# Patient Record
Sex: Female | Born: 1935
Health system: Southern US, Community
[De-identification: ages and names within clinical notes are randomized; demographics above are authoritative.]

## PROBLEM LIST (undated history)

## (undated) DIAGNOSIS — K219 Gastro-esophageal reflux disease without esophagitis: Secondary | ICD-10-CM

## (undated) DIAGNOSIS — E785 Hyperlipidemia, unspecified: Secondary | ICD-10-CM

## (undated) DIAGNOSIS — M1990 Unspecified osteoarthritis, unspecified site: Secondary | ICD-10-CM

## (undated) DIAGNOSIS — G2581 Restless legs syndrome: Secondary | ICD-10-CM

## (undated) DIAGNOSIS — L409 Psoriasis, unspecified: Secondary | ICD-10-CM

## (undated) HISTORY — PX: APPENDECTOMY: SHX54

## (undated) HISTORY — PX: ABDOMINAL HYSTERECTOMY: SHX81

---

## 2011-11-02 ENCOUNTER — Other Ambulatory Visit: Payer: Self-pay | Admitting: Orthopedic Surgery

## 2011-11-02 MED ORDER — BUPIVACAINE 0.25 % ON-Q PUMP SINGLE CATH 300ML: 300.0000 mL | INJECTION | Status: DC

## 2011-11-02 MED ORDER — DEXAMETHASONE SODIUM PHOSPHATE 10 MG/ML IJ SOLN: 10.0000 mg | Freq: Once | INTRAMUSCULAR | Status: DC

## 2011-11-02 NOTE — Progress Notes (Signed)
Preoperative surgical orders have been place into the Epic hospital system for [facility] on 11/02/2011, 8:50 AM  by Patrica Duel for surgery on 11/23/2011.  Preop Total Knee orders including Bupivacaine On-Q pump, IV Tylenol, and IV Decadron as long as there are no contraindications to the above medications. Avel Peace, PA-C

## 2011-11-10 ENCOUNTER — Encounter (HOSPITAL_COMMUNITY): Payer: Self-pay | Admitting: Pharmacy Technician

## 2011-11-16 ENCOUNTER — Encounter (HOSPITAL_COMMUNITY): Payer: Self-pay

## 2011-11-16 ENCOUNTER — Encounter (HOSPITAL_COMMUNITY)
Admission: RE | Admit: 2011-11-16 | Discharge: 2011-11-16 | Disposition: A | Discharge status: Home / Self Care | Payer: Medicare Other | Source: Ambulatory Visit | Attending: Orthopedic Surgery | Admitting: Orthopedic Surgery

## 2011-11-16 HISTORY — DX: Restless legs syndrome: G25.81

## 2011-11-16 HISTORY — DX: Gastro-esophageal reflux disease without esophagitis: K21.9

## 2011-11-16 HISTORY — DX: Hyperlipidemia, unspecified: E78.5

## 2011-11-16 HISTORY — DX: Unspecified osteoarthritis, unspecified site: M19.90

## 2011-11-16 HISTORY — DX: Psoriasis, unspecified: L40.9

## 2011-11-16 LAB — CBC
MCHC: 33.3 g/dL (ref 30.0–36.0)
MCV: 92.3 fL (ref 78.0–100.0)
Platelets: 300 10*3/uL (ref 150–400)
RDW: 13.1 % (ref 11.5–15.5)
WBC: 7.1 10*3/uL (ref 4.0–10.5)

## 2011-11-16 LAB — URINALYSIS, ROUTINE W REFLEX MICROSCOPIC
Bilirubin Urine: NEGATIVE
Hgb urine dipstick: NEGATIVE
Ketones, ur: NEGATIVE mg/dL
Protein, ur: NEGATIVE mg/dL
Urobilinogen, UA: 0.2 mg/dL (ref 0.0–1.0)

## 2011-11-16 LAB — SURGICAL PCR SCREEN: Staphylococcus aureus: NEGATIVE

## 2011-11-16 MED ORDER — CHLORHEXIDINE GLUCONATE 4 % EX LIQD
60.0000 mL | Freq: Once | CUTANEOUS | Status: DC
  Filled 2011-11-16: qty 60

## 2011-11-16 NOTE — Patient Instructions (Addendum)
20 Deborah Kim  11/16/2011   Your procedure is scheduled on: 11/23/11 AT 8:30 AM  Report to SHORT STAY DEPT  At 6:00 AM.  Call this number if you have problems the morning of surgery: [phone]   Remember:   Do not eat food or drink liquids AFTER MIDNIGHT    Take these medicines the morning of surgery with A SIP OF WATER:ZANTAC   Do not wear jewelry, make-up or nail polish.  Do not wear lotions, powders, or perfumes.   Do not shave legs or underarms 48 hrs. before surgery (men may shave face)  Do not bring valuables to the hospital.  Contacts, dentures or bridgework may not be worn into surgery.  Leave suitcase in the car. After surgery it may be brought to your room.  For patients admitted to the hospital, checkout time is 11:00 AM the day of discharge.   Patients discharged the day of surgery will not be allowed to drive home. If going home same day of surgery, must have someone stay with you first 24 hrs at home and arrange for some one to drive you home from hospital.    Special Instructions:   Please read over the following fact sheets that you were given: MRSA  Information / Incentive Spirometer               SHOWER WITH BETASEPT THE NIGHT BEFORE SURGERY AND THE MORNING OF SURGERY          X_______________________________________________________________

## 2011-11-18 NOTE — H&P (Signed)
Deborah Kim DOB: January 09, 1935  Chief Complaint: left knee pain  History of Present Illness The patient is a 77 year old female who comes in today for a preoperative History and Physical. The patient is scheduled for a left total knee arthroplasty to be performed by Dr. Gus Rankin. Aluisio, MD at [facility] on Monday November 23, 2011 . The patient is a 77 year old female who presented with knee complaints. The patient reports left knee symptoms including pain and stiffness which began 4 years ago without any known injury.The patient feels that the symptoms are worsening. Previous work-up for this problem has included arthroscopy in the 80's. She states that the left knee has not been swelling much and pain is mainly when she is standing on it. When she is sitting down and resting the pain gets better. If she has had a very busy day then she will have discomfort at night, otherwise is not having night pain. She is not having any lower extremity weakness or paresthesia. The right knee is currently not hurting. Hips are not hurting. The knee is starting to limit what she can and can not do. She has had cortisone injections three times this year with decreasing effectiveness. Due to the failure of conservative measures, the most predictable means for increased function and decreased pain in the left knee is a left total knee arthroplasty.    Problem List/Past MedicalHistory Osteoarthritis, Knee (715.96) Knee Pain (719.46) Hypercholesterolemia Cataract Pneumonia Urinary Tract Infection Measles Mumps Menopause   Family History Cerebrovascular Accident. brother Hypertension. mother Father. deceased age 14 due to "old age"; cardiomegaly Mother. living age 68; HTN Siblings. brother: lung cancer, stroke, DM type II   Social History Children. 1 Alcohol use. current drinker; drinks wine; only occasionally per week Current work status. retired Exercise.  Exercises rarely Illicit drug use. no Living situation. live with spouse Marital status. married Pain Contract. no Tobacco use. former smoker; smoke(d) 1 pack(s) per day Tobacco / smoke exposure. no Post-Surgical Plans. home with husband Advance Directives. living will   Medication History Calcium & Magnesium Carbonates (520-400MG  Tablet, Oral) Active. Iron Caps CR ( Oral) Specific dose unknown - Active. Vitamin D (1000UNIT Capsule, Oral) Active. Vitamin B 12 ( Lozenge, Oral) Active. Iron (325 (65 Fe)MG Tablet, Oral) Active. Bromaline Plus (500-2-12.5MG  Tablet, Oral) Active. Aspirin EC (81MG  Tablet DR, Oral three times a week) Active.     Past Surgical History Hysterectomy. complete (non-cancerous) 1974 Appendectomy. 1974 Arthroscopic Knee Surgery - Left. 1985    Review of Systems General:Not Present- Chills, Fever, Night Sweats, Fatigue, Weight Gain, Weight Loss and Memory Loss. Skin:Not Present- Hives, Itching, Rash, Eczema and Lesions. HEENT:Not Present- Tinnitus, Headache, Double Vision, Visual Loss, Hearing Loss and Dentures. Respiratory:Present- Allergies. Not Present- Shortness of breath with exertion, Shortness of breath at rest, Coughing up blood and Chronic Cough. Cardiovascular:Not Present- Chest Pain, Racing/skipping heartbeats, Difficulty Breathing Lying Down, Murmur, Swelling and Palpitations. Gastrointestinal:Present- Heartburn. Not Present- Bloody Stool, Abdominal Pain, Vomiting, Nausea, Constipation, Diarrhea, Difficulty Swallowing, Jaundice and Loss of appetitie. Female Genitourinary:Not Present- Blood in Urine, Urinary frequency, Weak urinary stream, Discharge, Flank Pain, Incontinence, Painful Urination, Urgency, Urinary Retention and Urinating at Night. Musculoskeletal:Present- Joint Pain. Not Present- Muscle Weakness, Muscle Pain, Joint Swelling, Back Pain, Morning Stiffness and Spasms. Neurological:Not Present- Tremor,  Dizziness, Blackout spells, Paralysis, Difficulty with balance and Weakness. Psychiatric:Present- Insomnia.   Vitals Weight: 165 lb Height: 65 in Body Surface Area: 1.85 m Body Mass Index: 27.46 kg/m Pulse:  72 (Regular) Resp.: 16 (Unlabored) BP: 128/84 (Sitting, Left Arm, Standard)    Physical Exam General Mental Status - Alert, cooperative and good historian. General Appearance- pleasant. Not in acute distress. Orientation- Oriented X3. Build & Nutrition- Well nourished and Well developed. Head and Neck Head- normocephalic, atraumatic . Neck Global Assessment- supple. no bruit auscultated on the right and no bruit auscultated on the left. Eye Pupil- Bilateral- Regular and Round. Motion- Bilateral- EOMI. Chest and Lung Exam Auscultation: Breath sounds:- clear at anterior chest wall and - clear at posterior chest wall. Adventitious sounds:- No Adventitious sounds. Cardiovascular Auscultation:Rhythm- Regular rate and rhythm. Heart Sounds- S1 WNL and S2 WNL. Murmurs & Other Heart Sounds:Auscultation of the heart reveals - No Murmurs. Abdomen Palpation/Percussion:Tenderness- Abdomen is non-tender to palpation. Rigidity (guarding)- Abdomen is soft. Auscultation:Auscultation of the abdomen reveals - Bowel sounds normal. Female Genitourinary Not done, not pertinent to present illness Peripheral Vascular Upper Extremity: Palpation:- Pulses bilaterally normal. Lower Extremity: Palpation:- Pulses bilaterally normal. Neurologic Examination of related systems reveals - normal muscle strength and tone in all extremities. Neurologic evaluation reveals - normal sensation. Musculoskeletal Her left knee shows no effusion. Range is 5 to 125, slight crepitus on range of motion. Slight tenderness, medial greater than lateral with no instability noted. Right knee exam is unremarkable.   RADIOGRAPHS: She does have significant degenerative  change in the medial compartment of the left knee.   Assessment & Plan Osteoarthritis, Knee (715.96) Left total knee arthroplasty     Dimitri Ped, PA-C

## 2011-11-22 NOTE — Anesthesia Preprocedure Evaluation (Addendum)
Anesthesia Evaluation  Patient identified by MRN, date of birth, ID band Patient awake    Reviewed: Allergy & Precautions, H&P , NPO status , Patient's Chart, lab work & pertinent test results  Airway Mallampati: II TM Distance: >3 FB Neck ROM: Full    Dental No notable dental hx.    Pulmonary former smoker,  breath sounds clear to auscultation  Pulmonary exam normal       Cardiovascular negative cardio ROS  Rhythm:Regular Rate:Normal     Neuro/Psych negative neurological ROS  negative psych ROS   GI/Hepatic negative GI ROS, Neg liver ROS,   Endo/Other  negative endocrine ROS  Renal/GU negative Renal ROS  negative genitourinary   Musculoskeletal negative musculoskeletal ROS (+)   Abdominal   Peds negative pediatric ROS (+)  Hematology negative hematology ROS (+)   Anesthesia Other Findings   Reproductive/Obstetrics negative OB ROS                          Anesthesia Physical Anesthesia Plan  ASA: II  Anesthesia Plan: Spinal   Post-op Pain Management:    Induction: Intravenous  Airway Management Planned:   Additional Equipment:   Intra-op Plan:   Post-operative Plan: Extubation in OR  Informed Consent: I have reviewed the patients History and Physical, chart, labs and discussed the procedure including the risks, benefits and alternatives for the proposed anesthesia with the patient or authorized representative who has indicated his/her understanding and acceptance.   Dental advisory given  Plan Discussed with: CRNA  Anesthesia Plan Comments: (Discussed risks/benefits of spinal including headache, backache, failure, bleeding, infection, and nerve damage. Patient consents to spinal. Questions answered. Coagulation studies and platelet count acceptable. )       Anesthesia Quick Evaluation

## 2011-11-23 ENCOUNTER — Encounter (HOSPITAL_COMMUNITY): Payer: Self-pay | Admitting: *Deleted

## 2011-11-23 ENCOUNTER — Encounter (HOSPITAL_COMMUNITY): Payer: Self-pay | Admitting: Anesthesiology

## 2011-11-23 ENCOUNTER — Encounter (HOSPITAL_COMMUNITY)
Admission: RE | Disposition: A | Discharge status: Home Health | Payer: Self-pay | Source: Ambulatory Visit | Attending: Orthopedic Surgery

## 2011-11-23 ENCOUNTER — Inpatient Hospital Stay (HOSPITAL_COMMUNITY): Payer: Medicare Other | Admitting: Anesthesiology

## 2011-11-23 ENCOUNTER — Inpatient Hospital Stay (HOSPITAL_COMMUNITY)
Admission: RE | Admit: 2011-11-23 | Discharge: 2011-11-26 | DRG: 470 | Disposition: A | Discharge status: Home Health | Payer: Medicare Other | Source: Ambulatory Visit | Attending: Orthopedic Surgery | Admitting: Orthopedic Surgery

## 2011-11-23 DIAGNOSIS — Z96659 Presence of unspecified artificial knee joint: Secondary | ICD-10-CM

## 2011-11-23 DIAGNOSIS — M1710 Unilateral primary osteoarthritis, unspecified knee: Principal | ICD-10-CM | POA: Diagnosis present

## 2011-11-23 DIAGNOSIS — M179 Osteoarthritis of knee, unspecified: Secondary | ICD-10-CM

## 2011-11-23 DIAGNOSIS — I498 Other specified cardiac arrhythmias: Secondary | ICD-10-CM | POA: Diagnosis not present

## 2011-11-23 DIAGNOSIS — D62 Acute posthemorrhagic anemia: Secondary | ICD-10-CM | POA: Diagnosis not present

## 2011-11-23 DIAGNOSIS — E871 Hypo-osmolality and hyponatremia: Secondary | ICD-10-CM | POA: Diagnosis not present

## 2011-11-23 DIAGNOSIS — Z9289 Personal history of other medical treatment: Secondary | ICD-10-CM

## 2011-11-23 DIAGNOSIS — Z01812 Encounter for preprocedural laboratory examination: Secondary | ICD-10-CM

## 2011-11-23 DIAGNOSIS — R000 Tachycardia, unspecified: Secondary | ICD-10-CM | POA: Diagnosis not present

## 2011-11-23 HISTORY — PX: TOTAL KNEE ARTHROPLASTY: SHX125

## 2011-11-23 SURGERY — ARTHROPLASTY, KNEE, TOTAL
Anesthesia: Spinal | Site: Knee | Laterality: Left | Wound class: Clean

## 2011-11-23 MED ORDER — NALOXONE HCL 0.4 MG/ML IJ SOLN: 0.4000 mg | INTRAMUSCULAR | Status: DC | PRN

## 2011-11-23 MED ORDER — CALCIPOTRIENE 0.005 % EX CREA: 1.0000 "application " | TOPICAL_CREAM | CUTANEOUS | Status: DC | PRN

## 2011-11-23 MED ORDER — BUPIVACAINE IN DEXTROSE 0.75-8.25 % IT SOLN
INTRATHECAL | Status: DC | PRN
  Administered 2011-11-23: 15 mg via INTRATHECAL

## 2011-11-23 MED ORDER — MENTHOL 3 MG MT LOZG: 1.0000 | LOZENGE | OROMUCOSAL | Status: DC | PRN

## 2011-11-23 MED ORDER — SODIUM CHLORIDE 0.9 % IR SOLN
Status: DC | PRN
  Administered 2011-11-23: 1000 mL

## 2011-11-23 MED ORDER — ACETAMINOPHEN 10 MG/ML IV SOLN: 1000.0000 mg | Freq: Once | INTRAVENOUS | Status: DC

## 2011-11-23 MED ORDER — TRIAMCINOLONE ACETONIDE 0.5 % EX CREA
TOPICAL_CREAM | Freq: Two times a day (BID) | CUTANEOUS | Status: DC
  Administered 2011-11-23 – 2011-11-24 (×3): via TOPICAL
  Administered 2011-11-25: 1 via TOPICAL
  Administered 2011-11-25 – 2011-11-26 (×2): via TOPICAL
  Filled 2011-11-23 (×2): qty 15

## 2011-11-23 MED ORDER — METOCLOPRAMIDE HCL 5 MG/ML IJ SOLN
5.0000 mg | Freq: Three times a day (TID) | INTRAMUSCULAR | Status: DC | PRN
  Administered 2011-11-23: 10 mg via INTRAVENOUS
  Filled 2011-11-23: qty 2

## 2011-11-23 MED ORDER — PHENOL 1.4 % MT LIQD: 1.0000 | OROMUCOSAL | Status: DC | PRN

## 2011-11-23 MED ORDER — BUPIVACAINE ON-Q PAIN PUMP (FOR ORDER SET NO CHG)
INJECTION | Status: DC
  Filled 2011-11-23: qty 1

## 2011-11-23 MED ORDER — DIPHENHYDRAMINE HCL 50 MG/ML IJ SOLN: 12.5000 mg | Freq: Four times a day (QID) | INTRAMUSCULAR | Status: DC | PRN

## 2011-11-23 MED ORDER — ONDANSETRON HCL 4 MG/2ML IJ SOLN
4.0000 mg | Freq: Four times a day (QID) | INTRAMUSCULAR | Status: DC | PRN
  Administered 2011-11-23: 4 mg via INTRAVENOUS

## 2011-11-23 MED ORDER — BISACODYL 10 MG RE SUPP: 10.0000 mg | Freq: Every day | RECTAL | Status: DC | PRN

## 2011-11-23 MED ORDER — ACETAMINOPHEN 325 MG PO TABS: 650.0000 mg | ORAL_TABLET | Freq: Four times a day (QID) | ORAL | Status: DC | PRN

## 2011-11-23 MED ORDER — CEFAZOLIN SODIUM 1-5 GM-% IV SOLN
1.0000 g | Freq: Four times a day (QID) | INTRAVENOUS | Status: AC
  Administered 2011-11-23 (×2): 1 g via INTRAVENOUS
  Filled 2011-11-23 (×2): qty 50

## 2011-11-23 MED ORDER — OXYCODONE HCL 5 MG PO TABS
5.0000 mg | ORAL_TABLET | ORAL | Status: DC | PRN
  Administered 2011-11-23: 5 mg via ORAL
  Administered 2011-11-24 – 2011-11-26 (×11): 10 mg via ORAL
  Filled 2011-11-23: qty 2
  Filled 2011-11-23: qty 1
  Filled 2011-11-23 (×4): qty 2
  Filled 2011-11-23: qty 1
  Filled 2011-11-23 (×7): qty 2

## 2011-11-23 MED ORDER — DEXTROSE 5 % IV SOLN
3.0000 g | INTRAVENOUS | Status: AC
  Administered 2011-11-23: 2 g via INTRAVENOUS
  Filled 2011-11-23: qty 3000

## 2011-11-23 MED ORDER — DIPHENHYDRAMINE HCL 12.5 MG/5ML PO ELIX: 12.5000 mg | ORAL_SOLUTION | ORAL | Status: DC | PRN

## 2011-11-23 MED ORDER — 0.9 % SODIUM CHLORIDE (POUR BTL) OPTIME
TOPICAL | Status: DC | PRN
  Administered 2011-11-23: 1000 mL

## 2011-11-23 MED ORDER — METHOCARBAMOL 100 MG/ML IJ SOLN
500.0000 mg | Freq: Four times a day (QID) | INTRAVENOUS | Status: DC | PRN
  Administered 2011-11-23: 500 mg via INTRAVENOUS
  Filled 2011-11-23: qty 5

## 2011-11-23 MED ORDER — SODIUM CHLORIDE 0.9 % IV SOLN
INTRAVENOUS | Status: DC
  Administered 2011-11-23: 13:00:00 via INTRAVENOUS
  Administered 2011-11-24: 20 mL/h via INTRAVENOUS
  Administered 2011-11-24: 05:00:00 via INTRAVENOUS

## 2011-11-23 MED ORDER — MIDAZOLAM HCL 5 MG/5ML IJ SOLN
INTRAMUSCULAR | Status: DC | PRN
  Administered 2011-11-23: 2 mg via INTRAVENOUS

## 2011-11-23 MED ORDER — METHOCARBAMOL 500 MG PO TABS
500.0000 mg | ORAL_TABLET | Freq: Four times a day (QID) | ORAL | Status: DC | PRN
  Administered 2011-11-24 – 2011-11-26 (×6): 500 mg via ORAL
  Filled 2011-11-23 (×7): qty 1

## 2011-11-23 MED ORDER — PROMETHAZINE HCL 25 MG/ML IJ SOLN: 6.2500 mg | INTRAMUSCULAR | Status: DC | PRN

## 2011-11-23 MED ORDER — DIPHENHYDRAMINE HCL 12.5 MG/5ML PO ELIX: 12.5000 mg | ORAL_SOLUTION | Freq: Four times a day (QID) | ORAL | Status: DC | PRN

## 2011-11-23 MED ORDER — FLEET ENEMA 7-19 GM/118ML RE ENEM: 1.0000 | ENEMA | Freq: Once | RECTAL | Status: AC | PRN

## 2011-11-23 MED ORDER — FAMOTIDINE 20 MG PO TABS
20.0000 mg | ORAL_TABLET | Freq: Every day | ORAL | Status: DC | PRN
  Administered 2011-11-24: 20 mg via ORAL
  Filled 2011-11-23 (×2): qty 1

## 2011-11-23 MED ORDER — SODIUM CHLORIDE 0.9 % IJ SOLN: 9.0000 mL | INTRAMUSCULAR | Status: DC | PRN

## 2011-11-23 MED ORDER — SODIUM CHLORIDE 0.9 % IV SOLN: INTRAVENOUS | Status: DC

## 2011-11-23 MED ORDER — EPHEDRINE SULFATE 50 MG/ML IJ SOLN
INTRAMUSCULAR | Status: DC | PRN
  Administered 2011-11-23: 10 mg via INTRAVENOUS
  Administered 2011-11-23: 5 mg via INTRAVENOUS

## 2011-11-23 MED ORDER — POLYETHYLENE GLYCOL 3350 17 G PO PACK: 17.0000 g | PACK | Freq: Every day | ORAL | Status: DC | PRN

## 2011-11-23 MED ORDER — TRAMADOL HCL 50 MG PO TABS: 50.0000 mg | ORAL_TABLET | Freq: Four times a day (QID) | ORAL | Status: DC | PRN

## 2011-11-23 MED ORDER — ONDANSETRON HCL 4 MG PO TABS: 4.0000 mg | ORAL_TABLET | Freq: Four times a day (QID) | ORAL | Status: DC | PRN

## 2011-11-23 MED ORDER — FENTANYL CITRATE 0.05 MG/ML IJ SOLN
INTRAMUSCULAR | Status: DC | PRN
  Administered 2011-11-23: 100 ug via INTRAVENOUS

## 2011-11-23 MED ORDER — LACTATED RINGERS IV SOLN
INTRAVENOUS | Status: DC | PRN
  Administered 2011-11-23 (×3): via INTRAVENOUS

## 2011-11-23 MED ORDER — BUPIVACAINE 0.25 % ON-Q PUMP SINGLE CATH 300ML
INJECTION | Status: DC | PRN
  Administered 2011-11-23: 300 mL

## 2011-11-23 MED ORDER — ONDANSETRON HCL 4 MG/2ML IJ SOLN
4.0000 mg | Freq: Four times a day (QID) | INTRAMUSCULAR | Status: DC | PRN
  Filled 2011-11-23: qty 2

## 2011-11-23 MED ORDER — FLUOCINONIDE 0.05 % EX SOLN
1.0000 "application " | CUTANEOUS | Status: DC
  Filled 2011-11-23: qty 60

## 2011-11-23 MED ORDER — HYDROMORPHONE HCL PF 1 MG/ML IJ SOLN: 0.2500 mg | INTRAMUSCULAR | Status: DC | PRN

## 2011-11-23 MED ORDER — ACETAMINOPHEN 10 MG/ML IV SOLN
1000.0000 mg | Freq: Four times a day (QID) | INTRAVENOUS | Status: AC
  Administered 2011-11-23 – 2011-11-24 (×4): 1000 mg via INTRAVENOUS
  Filled 2011-11-23 (×6): qty 100

## 2011-11-23 MED ORDER — PROPOFOL 10 MG/ML IV EMUL
INTRAVENOUS | Status: DC | PRN
  Administered 2011-11-23: 50 ug/kg/min via INTRAVENOUS

## 2011-11-23 MED ORDER — MORPHINE SULFATE (PF) 1 MG/ML IV SOLN
INTRAVENOUS | Status: DC
  Administered 2011-11-23 (×2): 7 mg via INTRAVENOUS
  Administered 2011-11-23: 1 mg via INTRAVENOUS
  Administered 2011-11-24: 2 mg via INTRAVENOUS
  Filled 2011-11-23: qty 25

## 2011-11-23 MED ORDER — ACETAMINOPHEN 650 MG RE SUPP: 650.0000 mg | Freq: Four times a day (QID) | RECTAL | Status: DC | PRN

## 2011-11-23 MED ORDER — DOCUSATE SODIUM 100 MG PO CAPS
100.0000 mg | ORAL_CAPSULE | Freq: Two times a day (BID) | ORAL | Status: DC
  Administered 2011-11-23 – 2011-11-26 (×7): 100 mg via ORAL

## 2011-11-23 MED ORDER — METOCLOPRAMIDE HCL 10 MG PO TABS: 5.0000 mg | ORAL_TABLET | Freq: Three times a day (TID) | ORAL | Status: DC | PRN

## 2011-11-23 MED ORDER — RIVAROXABAN 10 MG PO TABS
10.0000 mg | ORAL_TABLET | Freq: Every day | ORAL | Status: DC
  Administered 2011-11-24 – 2011-11-26 (×3): 10 mg via ORAL
  Filled 2011-11-23 (×4): qty 1

## 2011-11-23 SURGICAL SUPPLY — 50 items
BAG ZIPLOCK 12X15 (MISCELLANEOUS) ×2 IMPLANT
BANDAGE ELASTIC 6 VELCRO ST LF (GAUZE/BANDAGES/DRESSINGS) ×2 IMPLANT
BANDAGE ESMARK 6X9 LF (GAUZE/BANDAGES/DRESSINGS) ×1 IMPLANT
BLADE SAG 18X100X1.27 (BLADE) ×2 IMPLANT
BLADE SAW SGTL 11.0X1.19X90.0M (BLADE) ×2 IMPLANT
BNDG ESMARK 6X9 LF (GAUZE/BANDAGES/DRESSINGS) ×2
BOWL SMART MIX CTS (DISPOSABLE) ×2 IMPLANT
CATH KIT ON-Q SILVERSOAK 5IN (CATHETERS) ×2 IMPLANT
CEMENT HV SMART SET (Cement) ×4 IMPLANT
CLOTH BEACON ORANGE TIMEOUT ST (SAFETY) ×2 IMPLANT
CUFF TOURN SGL QUICK 34 (TOURNIQUET CUFF) ×1
CUFF TRNQT CYL 34X4X40X1 (TOURNIQUET CUFF) ×1 IMPLANT
DRAPE EXTREMITY T 121X128X90 (DRAPE) ×2 IMPLANT
DRAPE POUCH INSTRU U-SHP 10X18 (DRAPES) ×2 IMPLANT
DRAPE U-SHAPE 47X51 STRL (DRAPES) ×2 IMPLANT
DRSG ADAPTIC 3X8 NADH LF (GAUZE/BANDAGES/DRESSINGS) ×2 IMPLANT
DRSG PAD ABDOMINAL 8X10 ST (GAUZE/BANDAGES/DRESSINGS) ×2 IMPLANT
DURAPREP 26ML APPLICATOR (WOUND CARE) ×2 IMPLANT
ELECT REM PT RETURN 9FT ADLT (ELECTROSURGICAL) ×2
ELECTRODE REM PT RTRN 9FT ADLT (ELECTROSURGICAL) ×1 IMPLANT
EVACUATOR 1/8 PVC DRAIN (DRAIN) ×2 IMPLANT
FACESHIELD LNG OPTICON STERILE (SAFETY) ×10 IMPLANT
GLOVE BIO SURGEON STRL SZ8 (GLOVE) ×2 IMPLANT
GLOVE BIOGEL PI IND STRL 8 (GLOVE) ×2 IMPLANT
GLOVE BIOGEL PI INDICATOR 8 (GLOVE) ×2
GLOVE ECLIPSE 8.0 STRL XLNG CF (GLOVE) ×2 IMPLANT
GLOVE SURG SS PI 6.5 STRL IVOR (GLOVE) ×4 IMPLANT
GOWN STRL NON-REIN LRG LVL3 (GOWN DISPOSABLE) ×4 IMPLANT
GOWN STRL REIN XL XLG (GOWN DISPOSABLE) ×2 IMPLANT
HANDPIECE INTERPULSE COAX TIP (DISPOSABLE) ×1
IMMOBILIZER KNEE 20 (SOFTGOODS) ×2
IMMOBILIZER KNEE 20 THIGH 36 (SOFTGOODS) ×1 IMPLANT
KIT BASIN OR (CUSTOM PROCEDURE TRAY) ×2 IMPLANT
MANIFOLD NEPTUNE II (INSTRUMENTS) ×2 IMPLANT
NS IRRIG 1000ML POUR BTL (IV SOLUTION) ×2 IMPLANT
PACK TOTAL JOINT (CUSTOM PROCEDURE TRAY) ×2 IMPLANT
PADDING CAST COTTON 6X4 STRL (CAST SUPPLIES) ×6 IMPLANT
POSITIONER SURGICAL ARM (MISCELLANEOUS) ×2 IMPLANT
SET HNDPC FAN SPRY TIP SCT (DISPOSABLE) ×1 IMPLANT
SPONGE GAUZE 4X4 12PLY (GAUZE/BANDAGES/DRESSINGS) ×2 IMPLANT
STRIP CLOSURE SKIN 1/2X4 (GAUZE/BANDAGES/DRESSINGS) ×4 IMPLANT
SUCTION FRAZIER 12FR DISP (SUCTIONS) ×2 IMPLANT
SUT MNCRL AB 4-0 PS2 18 (SUTURE) ×2 IMPLANT
SUT VIC AB 2-0 CT1 27 (SUTURE) ×3
SUT VIC AB 2-0 CT1 TAPERPNT 27 (SUTURE) ×3 IMPLANT
SUT VLOC 180 0 24IN GS25 (SUTURE) ×2 IMPLANT
TOWEL OR 17X26 10 PK STRL BLUE (TOWEL DISPOSABLE) ×4 IMPLANT
TRAY FOLEY CATH 14FRSI W/METER (CATHETERS) ×2 IMPLANT
WATER STERILE IRR 1500ML POUR (IV SOLUTION) ×4 IMPLANT
WRAP KNEE MAXI GEL POST OP (GAUZE/BANDAGES/DRESSINGS) ×2 IMPLANT

## 2011-11-23 NOTE — Transfer of Care (Signed)
Immediate Anesthesia Transfer of Care Note  Patient: Deborah Kim  Procedure(s) Performed: Procedure(s) (LRB) with comments: TOTAL KNEE ARTHROPLASTY (Left)  Patient Location: PACU  Anesthesia Type: Spinal  Level of Consciousness: awake, alert  and oriented  Airway & Oxygen Therapy: Patient Spontanous Breathing and Patient connected to face mask oxygen  Post-op Assessment: Report given to PACU RN and Post -op Vital signs reviewed and stable  Post vital signs: Reviewed and stable  Complications: No apparent anesthesia complications

## 2011-11-23 NOTE — Op Note (Signed)
Pre-operative diagnosis- Osteoarthritis  Left knee(s)  Post-operative diagnosis- Osteoarthritis Left knee(s)  Procedure-  Left  Total Knee Arthroplasty  Surgeon- Deborah Rankin. Patriciann Becht, MD  Assistant- Dimitri Ped, PA-C   Anesthesia-  Spinal EBL-* No blood loss amount entered *  Drains Hemovac  Tourniquet time-  Total Tourniquet Time Documented: Thigh (Left) - 34 minutes   Complications- None  Condition-PACU - hemodynamically stable.   Brief Clinical Note  Deborah Kim is a 77 y.o. year old female with end stage OA of her left knee with progressively worsening pain and dysfunction. She has constant pain, with activity and at rest and significant functional deficits with difficulties even with ADLs. She has had extensive non-op management including analgesics, injections of cortisone and viscosupplements, and home exercise program, but remains in significant pain with significant dysfunction. Radiographs show bone on bone arthritis medial and patellofemoral. She presents now for left Total Knee Arthroplasty.    Procedure in detail---   The patient is brought into the operating room and positioned supine on the operating table. After successful administration of  Spinal,   a tourniquet is placed high on the  Left thigh(s) and the lower extremity is prepped and draped in the usual sterile fashion. Time out is performed by the operating team and then the  Left lower extremity is wrapped in Esmarch, knee flexed and the tourniquet inflated to 300 mmHg.       A midline incision is made with a ten blade through the subcutaneous tissue to the level of the extensor mechanism. A fresh blade is used to make a medial parapatellar arthrotomy. Soft tissue over the proximal medial tibia is subperiosteally elevated to the joint line with a knife and into the semimembranosus bursa with a Cobb elevator. Soft tissue over the proximal lateral tibia is elevated with attention being paid to avoiding the patellar  tendon on the tibial tubercle. The patella is everted, knee flexed 90 degrees and the ACL and PCL are removed. Findings are bone on bone medial and patellofemoral with large medial osteophytes.        The drill is used to create a starting hole in the distal femur and the canal is thoroughly irrigated with sterile saline to remove the fatty contents. The 5 degree Left  valgus alignment guide is placed into the femoral canal and the distal femoral cutting block is pinned to remove 11 mm off the distal femur. Resection is made with an oscillating saw.      The tibia is subluxed forward and the menisci are removed. The extramedullary alignment guide is placed referencing proximally at the medial aspect of the tibial tubercle and distally along the second metatarsal axis and tibial crest. The block is pinned to remove 2mm off the more deficient medial  side. Resection is made with an oscillating saw. Size 3is the most appropriate size for the tibia and the proximal tibia is prepared with the modular drill and keel punch for that size.      The femoral sizing guide is placed and size 4 narrow is most appropriate. Rotation is marked off the epicondylar axis and confirmed by creating a rectangular flexion gap at 90 degrees. The size 4 cutting block is pinned in this rotation and the anterior, posterior and chamfer cuts are made with the oscillating saw. The intercondylar block is then placed and that cut is made.      Trial size 3 tibial component, trial size 4 narrow posterior stabilized femur and a 10  mm posterior stabilized rotating platform insert trial is placed. Full extension is achieved with excellent varus/valgus and anterior/posterior balance throughout full range of motion. The patella is everted and thickness measured to be 22  mm. Free hand resection is taken to 12 mm, a 38 template is placed, lug holes are drilled, trial patella is placed, and it tracks normally. Osteophytes are removed off the posterior  femur with the trial in place. All trials are removed and the cut bone surfaces prepared with pulsatile lavage. Cement is mixed and once ready for implantation, the size 3 tibial implant, size  4 narrow posterior stabilized femoral component, and the size 38 patella are cemented in place and the patella is held with the clamp. The trial insert is placed and the knee held in full extension. All extruded cement is removed and once the cement is hard the permanent 10 mm posterior stabilized rotating platform insert is placed into the tibial tray.      The wound is copiously irrigated with saline solution and the extensor mechanism closed over a hemovac drain with #1 PDS suture. The tourniquet is released for a total tourniquet time of 34  minutes. Flexion against gravity is 140 degrees and the patella tracks normally. Subcutaneous tissue is closed with 2.0 vicryl and subcuticular with running 4.0 Monocryl. The catheter for the Marcaine pain pump is placed and the pump is initiated. The incision is cleaned and dried and steri-strips and a bulky sterile dressing are applied. The limb is placed into a knee immobilizer and the patient is awakened and transported to recovery in stable condition.      Please note that a surgical assistant was a medical necessity for this procedure in order to perform it in a safe and expeditious manner. Surgical assistant was necessary to retract the ligaments and vital neurovascular structures to prevent injury to them and also necessary for proper positioning of the limb to allow for anatomic placement of the prosthesis.   Deborah Rankin Evelen Vazguez, MD    11/23/2011, 9:33 AM

## 2011-11-23 NOTE — Anesthesia Postprocedure Evaluation (Signed)
  Anesthesia Post-op Note  Patient: Deborah Kim  Procedure(s) Performed: Procedure(s) (LRB): TOTAL KNEE ARTHROPLASTY (Left)  Patient Location: PACU  Anesthesia Type: Spinal  Level of Consciousness: awake and alert   Airway and Oxygen Therapy: Patient Spontanous Breathing  Post-op Pain: mild  Post-op Assessment: Post-op Vital signs reviewed, Patient's Cardiovascular Status Stable, Respiratory Function Stable, Patent Airway and No signs of Nausea or vomiting  Post-op Vital Signs: stable  Complications: No apparent anesthesia complications

## 2011-11-23 NOTE — Interval H&P Note (Signed)
History and Physical Interval Note:  11/23/2011 7:08 AM  Deborah Kim  has presented today for surgery, with the diagnosis of osteoarthritis left knee  The various methods of treatment have been discussed with the patient and family. After consideration of risks, benefits and other options for treatment, the patient has consented to  Procedure(s) (LRB) with comments: TOTAL KNEE ARTHROPLASTY (Left) as a surgical intervention .  The patient's history has been reviewed, patient examined, no change in status, stable for surgery.  I have reviewed the patient's chart and labs.  Questions were answered to the patient's satisfaction.     Loanne Drilling

## 2011-11-23 NOTE — Progress Notes (Signed)
Utilization review completed.  

## 2011-11-23 NOTE — Anesthesia Procedure Notes (Signed)
Spinal  Patient location during procedure: OR Start time: 11/23/2011 8:30 AM End time: 11/23/2011 8:32 AM Staffing CRNA/Resident: Carmelia Roller R Preanesthetic Checklist Completed: patient identified, site marked, surgical consent, pre-op evaluation, timeout performed, IV checked, risks and benefits discussed and monitors and equipment checked Spinal Block Patient position: sitting Prep: Betadine Patient monitoring: heart rate, continuous pulse ox, cardiac monitor and blood pressure Approach: midline Location: L3-4 Injection technique: single-shot Needle Needle type: Sprotte  Needle gauge: 25 G Needle length: 9 cm Assessment Sensory level: T6

## 2011-11-24 ENCOUNTER — Encounter (HOSPITAL_COMMUNITY): Payer: Self-pay | Admitting: Orthopedic Surgery

## 2011-11-24 DIAGNOSIS — E871 Hypo-osmolality and hyponatremia: Secondary | ICD-10-CM | POA: Diagnosis not present

## 2011-11-24 DIAGNOSIS — D62 Acute posthemorrhagic anemia: Secondary | ICD-10-CM | POA: Diagnosis not present

## 2011-11-24 LAB — CBC
MCH: 30.3 pg (ref 26.0–34.0)
MCV: 94 fL (ref 78.0–100.0)
Platelets: 223 10*3/uL (ref 150–400)
RDW: 13.3 % (ref 11.5–15.5)

## 2011-11-24 LAB — BASIC METABOLIC PANEL
Calcium: 7.9 mg/dL — ABNORMAL LOW (ref 8.4–10.5)
Creatinine, Ser: 0.49 mg/dL — ABNORMAL LOW (ref 0.50–1.10)
GFR calc Af Amer: >90 mL/min (ref >90)
GFR calc non Af Amer: >90 mL/min (ref >90)
Sodium: 133 mEq/L — ABNORMAL LOW (ref 135–145)

## 2011-11-24 MED ORDER — MORPHINE SULFATE 2 MG/ML IJ SOLN
1.0000 mg | INTRAMUSCULAR | Status: DC | PRN
  Filled 2011-11-24: qty 1

## 2011-11-24 NOTE — Evaluation (Signed)
Physical Therapy Evaluation Patient Details Name: Lana Flaim MRN: [id number] DOB: 05/14/1934 Today's Date: 11/24/2011 Time: 7829-5621 PT Time Calculation (min): 16 min  PT Assessment / Plan / Recommendation Clinical Impression  Pt s/p L TKR.  Pt would benefit from acute PT services in order to improve independence with transfers, ambulation, and stairs to prepare for d/c home with spouse.    PT Assessment  Patient needs continued PT services    Follow Up Recommendations  Home health PT    Barriers to Discharge        Equipment Recommendations  Rolling walker with 5" wheels    Recommendations for Other Services     Frequency 7X/week    Precautions / Restrictions Precautions Precautions: Knee Required Braces or Orthoses: Knee Immobilizer - Left Knee Immobilizer - Left: Discontinue once straight leg raise with < 10 degree lag Restrictions LLE Weight Bearing: Weight bearing as tolerated   Pertinent Vitals/Pain 4/10 L knee pain, repositioned, ice applied      Mobility  Bed Mobility Bed Mobility: Supine to Sit Supine to Sit: 4: Min assist;HOB elevated;With rails Details for Bed Mobility Assistance: verbal cues for technique, assist for L LE Transfers Transfers: Sit to Stand;Stand to Sit Sit to Stand: 4: Min assist;From bed Stand to Sit: 4: Min assist;To chair/3-in-1 Details for Transfer Assistance: verbal cues for safe technique, assist to rise and control descent Ambulation/Gait Ambulation/Gait Assistance: 4: Min guard Ambulation Distance (Feet): 40 Feet Assistive device: Rolling walker Ambulation/Gait Assistance Details: verbal cues for sequence, RW distance, posture Gait Pattern: Step-to pattern;Antalgic;Decreased stride length    Exercises     PT Diagnosis: Difficulty walking;Acute pain  PT Problem List: Decreased mobility;Decreased activity tolerance;Decreased range of motion;Decreased strength;Pain;Decreased knowledge of use of DME PT Treatment Interventions:  DME instruction;Gait training;Stair training;Functional mobility training;Therapeutic activities;Therapeutic exercise;Patient/family education   PT Goals Acute Rehab PT Goals PT Goal Formulation: With patient Time For Goal Achievement: 12/01/11 Potential to Achieve Goals: Good Pt will go Supine/Side to Sit: with supervision PT Goal: Supine/Side to Sit - Progress: Goal set today Pt will go Sit to Supine/Side: with supervision PT Goal: Sit to Supine/Side - Progress: Goal set today Pt will go Sit to Stand: with supervision PT Goal: Sit to Stand - Progress: Goal set today Pt will go Stand to Sit: with supervision PT Goal: Stand to Sit - Progress: Goal set today Pt will Ambulate: 51 - 150 feet;with supervision;with least restrictive assistive device PT Goal: Ambulate - Progress: Goal set today Pt will Go Up / Down Stairs: 1-2 stairs;with min assist;with least restrictive assistive device PT Goal: Up/Down Stairs - Progress: Goal set today  Visit Information  Last PT Received On: 11/24/11 Assistance Needed: +1    Subjective Data  Subjective: Oh you're the pain bringer.   Prior Functioning  Home Living Lives With: Spouse Type of Home: House Home Access: Stairs to enter Entrance Stairs-Number of Steps: 2 Entrance Stairs-Rails: None Home Layout: One level Home Adaptive Equipment: Shower chair without back;Other (comment) (rollator) Prior Function Level of Independence: Independent Communication Communication: No difficulties    Cognition  Overall Cognitive Status: Appears within functional limits for tasks assessed/performed Arousal/Alertness: Awake/alert Orientation Level: Appears intact for tasks assessed Behavior During Session: [facility] for tasks performed    Extremity/Trunk Assessment Right Upper Extremity Assessment RUE ROM/Strength/Tone: [facility] for tasks assessed Left Upper Extremity Assessment LUE ROM/Strength/Tone: [facility] for tasks assessed Right Lower Extremity Assessment RLE  ROM/Strength/Tone: [facility] for tasks assessed Left Lower Extremity Assessment LLE ROM/Strength/Tone: Deficits LLE  ROM/Strength/Tone Deficits: good quad contraction, kept in KI   Balance    End of Session PT - End of Session Equipment Utilized During Treatment: Gait belt;Left knee immobilizer Activity Tolerance: Patient tolerated treatment well Patient left: in chair;with call bell/phone within reach CPM Left Knee CPM Left Knee: Off  GP     Deejay Koppelman,[name] 11/24/2011, 9:51 AM Pager: [phone]

## 2011-11-24 NOTE — Care Management Note (Signed)
    Page 1 of 2   11/24/2011     11:46:29 AM   CARE MANAGEMENT NOTE 11/24/2011  Patient:  [facility]   Account Number:  [id number]  Date Initiated:  11/24/2011  Documentation initiated by:  Lorenda Ishihara  Subjective/Objective Assessment:   77 yo female admitted s/p left TKA. PTA lived at home with spouse.     Action/Plan:   Plan for d/c home with [facility], [facility] arranged pta   Anticipated DC Date:  11/26/2011   Anticipated DC Plan:  HOME W HOME HEALTH SERVICES      DC Planning Services  CM consult      PAC Choice  DURABLE MEDICAL EQUIPMENT  HOME HEALTH   Choice offered to / List presented to:  C-1 Patient   DME arranged  Levan Hurst      DME agency  [facility]     HH arranged  HH-2 PT      [facility] agency  [facility]   Status of service:  Completed, signed off Medicare Important Message given?   (If response is "NO", the following Medicare IM given date fields will be blank) Date Medicare IM given:   Date Additional Medicare IM given:    Discharge Disposition:  HOME W HOME HEALTH SERVICES  Per UR Regulation:  Reviewed for med. necessity/level of care/duration of stay  If discussed at Long Length of Stay Meetings, dates discussed:    Comments:  11-24-11 Lorenda Ishihara RN CM [id number] Recieved a referral for [facility] PT and RW. Patient followed by [name] as arranged preop. Contacted Debbie with [name] to arrange [facility] services and DME. Will continue to follow for d/c needs.

## 2011-11-24 NOTE — Progress Notes (Signed)
Physical Therapy Treatment Patient Details Name: Deborah Kim MRN: [id number] DOB: 11/28/1934 Today's Date: 11/24/2011 Time: 4098-1191 PT Time Calculation (min): 26 min  PT Assessment / Plan / Recommendation Comments on Treatment Session  Pt performed exercises and ambulated to/from bathroom this afternoon.    Follow Up Recommendations  Home health PT    Barriers to Discharge        Equipment Recommendations  Rolling walker with 5" wheels    Recommendations for Other Services    Frequency     Plan Discharge plan remains appropriate;Frequency remains appropriate    Precautions / Restrictions Precautions Precautions: Knee Required Braces or Orthoses: Knee Immobilizer - Left Knee Immobilizer - Left: Discontinue once straight leg raise with < 10 degree lag Restrictions LLE Weight Bearing: Weight bearing as tolerated   Pertinent Vitals/Pain 3/10 L knee pain, premedicated, repositioned    Mobility  Bed Mobility Bed Mobility: Sit to Supine Sit to Supine: 4: Min assist;HOB flat Details for Bed Mobility Assistance: verbal cues for technique, assist for L LE Transfers Transfers: Sit to Stand;Stand to Sit Sit to Stand: 4: Min guard;With upper extremity assist;From chair/3-in-1 Stand to Sit: 4: Min guard;With upper extremity assist;To chair/3-in-1;To bed Details for Transfer Assistance: verbal cues for safe technique Ambulation/Gait Ambulation/Gait Assistance: 4: Min guard Ambulation Distance (Feet): 8 Feet (x2 to/from bathroom) Assistive device: Rolling walker Ambulation/Gait Assistance Details: verbal cue for sequence and posture Gait Pattern: Step-to pattern;Decreased stance time - left;Decreased stride length    Exercises Total Joint Exercises Ankle Circles/Pumps: AROM;Both;20 reps Quad Sets: AROM;Both;20 reps Short Arc Quad: AAROM;Strengthening;Left;20 reps Heel Slides: 15 reps;Left;AAROM Hip ABduction/ADduction: AROM;Strengthening;Left;15 reps Straight Leg Raises:  AROM;Left;5 reps Goniometric ROM: L knee approx. -4-80* with active assist during heel slides   PT Diagnosis:    PT Problem List:   PT Treatment Interventions:     PT Goals Acute Rehab PT Goals PT Goal: Sit to Supine/Side - Progress: Progressing toward goal PT Goal: Sit to Stand - Progress: Progressing toward goal PT Goal: Stand to Sit - Progress: Progressing toward goal PT Goal: Ambulate - Progress: Progressing toward goal  Visit Information  Last PT Received On: 11/24/11 Assistance Needed: +1    Subjective Data  Subjective: I need to use the bathroom before I get back to bed.   Cognition  Overall Cognitive Status: Appears within functional limits for tasks assessed/performed    Balance     End of Session PT - End of Session Equipment Utilized During Treatment: Left knee immobilizer Activity Tolerance: Patient tolerated treatment well Patient left: in bed;with call bell/phone within reach;with family/visitor present CPM Left Knee CPM Left Knee: On   GP     Deborah Kim,Deborah Kim 11/24/2011, 4:16 PM Pager: [phone]

## 2011-11-24 NOTE — Progress Notes (Signed)
   Subjective: 1 Day Post-Op Procedure(s) (LRB): TOTAL KNEE ARTHROPLASTY (Left) Patient reports pain as mild.   Patient seen in rounds with Dr. Lequita Halt. Husband in room. Patient is well, and has had no acute complaints or problems We will start therapy today.  Plan is to go Home after hospital stay.  Objective: Vital signs in last 24 hours: Temp:  [96.3 F (35.7 C)-98.1 F (36.7 C)] 98.1 F (36.7 C) (09/17 0700) Pulse Rate:  [56-78] 73  (09/17 0700) Resp:  [7-20] 16  (09/17 0700) BP: (99-151)/(52-80) 151/78 mmHg (09/17 0700) SpO2:  [97 %-100 %] 99 % (09/17 0700) FiO2 (%):  [100 %] 100 % (09/16 1120) Weight:  [70.761 kg (156 lb)] 70.761 kg (156 lb) (09/16 1120)  Intake/Output from previous day:  Intake/Output Summary (Last 24 hours) at 11/24/11 0758 Last data filed at 11/24/11 0700  Gross per 24 hour  Intake   4838 ml  Output   2880 ml  Net   1958 ml    Intake/Output this shift:    Labs:  Basename 11/24/11 0427  HGB 10.1*    Basename 11/24/11 0427  WBC 8.6  RBC 3.33*  HCT 31.3*  PLT 223    Basename 11/24/11 0427  NA 133*  K 3.8  CL 99  CO2 29  BUN 6  CREATININE 0.49*  GLUCOSE 164*  CALCIUM 7.9*   No results found for this basename: LABPT:2,INR:2 in the last 72 hours  EXAM General - Patient is Alert, Appropriate and Oriented Extremity - Neurovascular intact Sensation intact distally Dorsiflexion/Plantar flexion intact Dressing - dressing C/D/I Motor Function - intact, moving foot and toes well on exam.  Hemovac pulled without difficulty.  Past Medical History  Diagnosis Date  . Hyperlipidemia   . Arthritis   . Psoriasis   . GERD (gastroesophageal reflux disease)   . Restless leg syndrome     Assessment/Plan: 1 Day Post-Op Procedure(s) (LRB): TOTAL KNEE ARTHROPLASTY (Left) Principal Problem:  *OA (osteoarthritis) of knee Active Problems:  Postop Hyponatremia  Postop Acute blood loss anemia   Advance diet Up with therapy Discharge  home with home health  DVT Prophylaxis - Xarelto, Aspirin 81 mg on hold for now. Weight-Bearing as tolerated to left leg No vaccines. D/C PCA Morphine, Change to IV push D/C O2 and Pulse OX and try on Room Air  Deborah Kim, Deborah Kim 11/24/2011, 7:58 AM

## 2011-11-25 ENCOUNTER — Inpatient Hospital Stay (HOSPITAL_COMMUNITY): Payer: Medicare Other

## 2011-11-25 LAB — BASIC METABOLIC PANEL
BUN: 6 mg/dL (ref 6–23)
CO2: 28 mEq/L (ref 19–32)
Chloride: 90 mEq/L — ABNORMAL LOW (ref 96–112)
Creatinine, Ser: 0.44 mg/dL — ABNORMAL LOW (ref 0.50–1.10)
GFR calc Af Amer: >90 mL/min (ref >90)
Glucose, Bld: 166 mg/dL — ABNORMAL HIGH (ref 70–99)
Potassium: 3.6 mEq/L (ref 3.5–5.1)

## 2011-11-25 LAB — CBC
HCT: 27.7 % — ABNORMAL LOW (ref 36.0–46.0)
HCT: 27.3 % — ABNORMAL LOW (ref 36.0–46.0)
Hemoglobin: 9.3 g/dL — ABNORMAL LOW (ref 12.0–15.0)
Hemoglobin: 9.1 g/dL — ABNORMAL LOW (ref 12.0–15.0)
MCH: 30.4 pg (ref 26.0–34.0)
MCHC: 33.3 g/dL (ref 30.0–36.0)
MCV: 92 fL (ref 78.0–100.0)
MCV: 91.3 fL (ref 78.0–100.0)
RBC: 3.01 MIL/uL — ABNORMAL LOW (ref 3.87–5.11)
RDW: 12.8 % (ref 11.5–15.5)
RDW: 12.8 % (ref 11.5–15.5)
WBC: 9.8 10*3/uL (ref 4.0–10.5)

## 2011-11-25 MED ORDER — OXYCODONE HCL 5 MG PO TABS: 5.0000 mg | ORAL_TABLET | ORAL | Status: DC | PRN

## 2011-11-25 MED ORDER — METHOCARBAMOL 500 MG PO TABS: 500.0000 mg | ORAL_TABLET | Freq: Four times a day (QID) | ORAL | Status: DC | PRN

## 2011-11-25 MED ORDER — SODIUM CHLORIDE 0.9 % IV SOLN
INTRAVENOUS | Status: AC
  Administered 2011-11-25: 16:00:00 via INTRAVENOUS

## 2011-11-25 MED ORDER — RIVAROXABAN 10 MG PO TABS: 10.0000 mg | ORAL_TABLET | Freq: Every day | ORAL | Status: DC

## 2011-11-25 MED ORDER — SODIUM CHLORIDE 0.9 % IV BOLUS (SEPSIS)
250.0000 mL | Freq: Once | INTRAVENOUS | Status: AC
  Administered 2011-11-25: 250 mL via INTRAVENOUS

## 2011-11-25 MED ORDER — POLYSACCHARIDE IRON COMPLEX 150 MG PO CAPS
150.0000 mg | ORAL_CAPSULE | Freq: Every day | ORAL | Status: DC
  Administered 2011-11-26: 150 mg via ORAL
  Filled 2011-11-25 (×2): qty 1

## 2011-11-25 NOTE — Plan of Care (Signed)
Problem: Consults Goal: Diagnosis- Total Joint Replacement Outcome: Completed/Met Date Met:  11/24/11 Primary Total Knee LEFT

## 2011-11-25 NOTE — Progress Notes (Signed)
11/25/11 1600  PT Visit Information  Last PT Received On 11/25/11  Assistance Needed +1  PT Time Calculation  PT Start Time 1448  PT Stop Time 1517  PT Time Calculation (min) 29 min  Subjective Data  Subjective i feel fine  Precautions  Precautions Knee  Required Braces or Orthoses Knee Immobilizer - Left  Knee Immobilizer - Left Discontinue once straight leg raise with < 10 degree lag  Restrictions  LLE Weight Bearing WBAT  Cognition  Overall Cognitive Status Appears within functional limits for tasks assessed/performed  Arousal/Alertness Awake/alert  Orientation Level Appears intact for tasks assessed  Behavior During Session [facility] for tasks performed  Bed Mobility  Bed Mobility Sit to Supine  Sit to Supine 4: Min guard;HOB flat  Details for Bed Mobility Assistance verbal cues for technique  Transfers  Transfers Sit to Stand;Stand to Sit  Sit to Stand 4: Min guard;5: Supervision;From chair/3-in-1 (X3)  Stand to Sit 4: Min guard;5: Supervision;To bed;To chair/3-in-1;With upper extremity assist  Details for Transfer Assistance Min cues for hand placement and safety.  Ambulation/Gait  Ambulation/Gait Assistance 5: Supervision  Ambulation Distance (Feet) 80 Feet  Assistive device Rolling walker  Ambulation/Gait Assistance Details cues for continuous steps  Gait Pattern Step-to pattern;Step-through pattern  Stairs Yes  Stairs Assistance 4: Min assist  Stairs Assistance Details (indicate cue type and reason) cues for sequence and correct use of crutches  Stair Management Technique Forwards;With crutches;Step to pattern  Number of Stairs 3  (X2)  Total Joint Exercises  Heel Slides AROM;5 reps;Left;Other (comment)  PT - End of Session  Equipment Utilized During Treatment Left knee immobilizer  Activity Tolerance Patient tolerated treatment well  Patient left in bed;with call bell/phone within reach;with nursing in room;with family/visitor present  Nurse Communication  Mobility status;Other (comment) (medical issues, see vitals)  PT - Assessment/Plan  Comments on Treatment Session pt with elevated HR  of 112 at rest to 137 with activity this pm, Sats 94 to 91% on RA; Pt reports no distress, doesn't feel like her heart is racing; RN notified and placed call to ortho PA;  PT Plan Discharge plan remains appropriate;Frequency remains appropriate  PT Frequency 7X/week  Follow Up Recommendations Home health PT  Equipment Recommended Rolling walker with 5" wheels  Acute Rehab PT Goals  Time For Goal Achievement 12/01/11  Potential to Achieve Goals Good  Pt will go Supine/Side to Sit with supervision  PT Goal: Supine/Side to Sit - Progress Progressing toward goal  Pt will go Sit to Supine/Side with supervision  PT Goal: Sit to Supine/Side - Progress Progressing toward goal  Pt will go Sit to Stand with supervision  PT Goal: Sit to Stand - Progress Progressing toward goal  Pt will go Stand to Sit with supervision  PT Goal: Stand to Sit - Progress Progressing toward goal  Pt will Ambulate 51 - 150 feet;with supervision;with least restrictive assistive device  PT Goal: Ambulate - Progress Progressing toward goal  Pt will Go Up / Down Stairs 1-2 stairs;with min assist;with least restrictive assistive device  PT Goal: Up/Down Stairs - Progress Progressing toward goal  PT General Charges  $$ ACUTE PT VISIT 1 Procedure  PT Treatments  $Gait Training 23-37 mins

## 2011-11-25 NOTE — Discharge Summary (Signed)
Physician Discharge Summary   Patient ID: Deborah Kim MRN: [id number] DOB/AGE: 75-Jan-1936 75 y.o.  Admit date: 11/23/2011 Discharge date: 11/26/2011  Primary Diagnosis: Osteoarthritis Left knee   Admission Diagnoses:  Past Medical History  Diagnosis Date  . Hyperlipidemia   . Arthritis   . Psoriasis   . GERD (gastroesophageal reflux disease)   . Restless leg syndrome    Discharge Diagnoses:   Principal Problem:  *OA (osteoarthritis) of knee Active Problems:  Postop Hyponatremia  Postop Acute blood loss anemia  Procedure:  Procedure(s) (LRB): TOTAL KNEE ARTHROPLASTY (Left)   Consults: None  HPI: Deborah Kim is a 77 y.o. year old female with end stage OA of her left knee with progressively worsening pain and dysfunction. She has constant pain, with activity and at rest and significant functional deficits with difficulties even with ADLs. She has had extensive non-op management including analgesics, injections of cortisone and viscosupplements, and home exercise program, but remains in significant pain with significant dysfunction. Radiographs show bone on bone arthritis medial and patellofemoral. She presents now for left Total Knee Arthroplasty.       Laboratory Data: Hospital Outpatient Visit on 11/16/2011  Component Date Value Range Status  . aPTT 11/16/2011 37  24 - 37 seconds Final   Comment:                                 IF BASELINE aPTT IS ELEVATED,                          SUGGEST PATIENT RISK ASSESSMENT                          BE USED TO DETERMINE APPROPRIATE                          ANTICOAGULANT THERAPY.  . WBC 11/16/2011 7.1  4.0 - 10.5 K/uL Final  . RBC 11/16/2011 4.52  3.87 - 5.11 MIL/uL Final  . Hemoglobin 11/16/2011 13.9  12.0 - 15.0 g/dL Final  . HCT 78/29/5621 41.7  36.0 - 46.0 % Final  . MCV 11/16/2011 92.3  78.0 - 100.0 fL Final  . MCH 11/16/2011 30.8  26.0 - 34.0 pg Final  . MCHC 11/16/2011 33.3  30.0 - 36.0 g/dL Final  . RDW 30/86/5784 13.1   11.5 - 15.5 % Final  . Platelets 11/16/2011 300  150 - 400 K/uL Final  . Prothrombin Time 11/16/2011 13.3  11.6 - 15.2 seconds Final  . INR 11/16/2011 0.99  0.00 - 1.49 Final  . ABO/RH(D) 11/16/2011 AB POS   Final  . Antibody Screen 11/16/2011 NEG   Final  . Sample Expiration 11/16/2011 11/26/2011   Final  . Color, Urine 11/16/2011 YELLOW  YELLOW Final  . APPearance 11/16/2011 CLEAR  CLEAR Final  . Specific Gravity, Urine 11/16/2011 1.014  1.005 - 1.030 Final  . pH 11/16/2011 7.0  5.0 - 8.0 Final  . Glucose, UA 11/16/2011 NEGATIVE  NEGATIVE mg/dL Final  . Hgb urine dipstick 11/16/2011 NEGATIVE  NEGATIVE Final  . Bilirubin Urine 11/16/2011 NEGATIVE  NEGATIVE Final  . Ketones, ur 11/16/2011 NEGATIVE  NEGATIVE mg/dL Final  . Protein, ur 69/62/9528 NEGATIVE  NEGATIVE mg/dL Final  . Urobilinogen, UA 11/16/2011 0.2  0.0 - 1.0 mg/dL Final  . Nitrite 41/32/4401 NEGATIVE  NEGATIVE Final  .  Leukocytes, UA 11/16/2011 NEGATIVE  NEGATIVE Final   MICROSCOPIC NOT DONE ON URINES WITH NEGATIVE PROTEIN, BLOOD, LEUKOCYTES, NITRITE, OR GLUCOSE <1000 mg/dL.  Marland Kitchen MRSA, PCR 11/16/2011 NEGATIVE  NEGATIVE Final  . Staphylococcus aureus 11/16/2011 NEGATIVE  NEGATIVE Final   Comment:                                 The Xpert SA Assay (FDA                          approved for NASAL specimens                          in patients over 7 years of age),                          is one component of                          a comprehensive surveillance                          program.  Test performance has                          been validated by [employer] for patients greater                          than or equal to 59 year old.                          It is not intended                          to diagnose infection nor to                          guide or monitor treatment.  . ABO/RH(D) 11/16/2011 AB POS   Final    Basename 11/25/11 0427 11/24/11 0427  HGB 9.3* 10.1*     Basename 11/25/11 0427 11/24/11 0427  WBC 9.8 8.6  RBC 3.01* 3.33*  HCT 27.7* 31.3*  PLT 207 223    Basename 11/25/11 0427 11/24/11 0427  NA 126* 133*  K 3.6 3.8  CL 90* 99  CO2 28 29  BUN 6 6  CREATININE 0.44* 0.49*  GLUCOSE 166* 164*  CALCIUM 8.1* 7.9*   No results found for this basename: LABPT:2,INR:2 in the last 72 hours  X-Rays:No results found.  EKG:No orders found for this or any previous visit.   Hospital Course: Patient was admitted to [facility] and taken to the OR and underwent the above state procedure without complications.  Patient tolerated the procedure well and was later transferred to the recovery room and then to the orthopaedic floor for postoperative care.  They were given PO and IV analgesics for pain control following their surgery.  They were given 24 hours of postoperative antibiotics and started on DVT prophylaxis in the form  of Xarelto.   PT and OT were ordered for total joint protocol.  Discharge planning consulted to help with postop disposition and equipment needs.  Patient had a good night on the evening of surgery and started to get up OOB with therapy on day one.  PCA Morphine was discontinued and they were weaned over to PO meds.  Hemovac drain was pulled without difficulty.  Continued to work with therapy into day two.  Dressing was changed on day two and the incision was healing well.  Noted to have increased pulse throughout the day.  EKG and CXR were checked.  EKG showed sinus tach.  CHEST - 2 VIEW - Findings: Heart size is upper normal. Negative for heart failure. Negative for pneumonia or mass lesion. No pleural effusion. IMPRESSION: Prominent heart size. No acute cardiopulmonary disease.  She was seen the following morning.  Had normal CXR and normal oxygen sats on room air so it is doubtful that an embolic event is causing the tachycardia.  By day three, patient seen in rounds by Dr. Lequita Halt on day three. HGB is down to 8.2. Pulse was  still fast today. Give blood and then discharge later this afternoon if does well.  Incision was healing well.  Patient was seen in rounds and was setup to go home later on day three.  Discharge Medications: Prior to Admission medications   Medication Sig Start Date End Date Taking? Authorizing Provider  betamethasone dipropionate (DIPROLENE) 0.05 % cream Apply 1 application topically 2 (two) times daily.    Historical Provider, MD  calcipotriene (DOVONOX) 0.005 % cream Apply 1 application topically as needed. psoriasis    Historical Provider, MD  ferrous sulfate (FER-IN-SOL) 75 (15 FE) MG/0.6ML drops Take 15 mg by mouth See admin instructions. 6 drops twice weekly    Historical Provider, MD  fluocinonide (LIDEX) 0.05 % external solution Apply 1 application topically 2 (two) times a week.    Historical Provider, MD  methocarbamol (ROBAXIN) 500 MG tablet Take 1 tablet (500 mg total) by mouth every 6 (six) hours as needed. 11/25/11   Wylene Weissman, PA  oxyCODONE (OXY IR/ROXICODONE) 5 MG immediate release tablet Take 1-2 tablets (5-10 mg total) by mouth every 3 (three) hours as needed for pain. 11/25/11   Shenouda Genova Julien Girt, PA  ranitidine (ZANTAC) 150 MG tablet Take 150 mg by mouth as needed. Heartburn    Historical Provider, MD  rivaroxaban (XARELTO) 10 MG TABS tablet Take 1 tablet (10 mg total) by mouth daily with breakfast. Take Xarelto for two and a half more weeks, then discontinue Xarelto. Once the patient has completed the Xarelto, they may resume the 81 mg Aspirin. 11/25/11   Hartman Minahan Julien Girt, PA    Diet: Cardiac diet Activity:WBAT Follow-up:in 2 weeks Disposition - Home Discharged Condition: Stable, home if improved after blood.   Discharge Orders    Future Orders Please Complete By Expires   Diet - low sodium heart healthy      Call MD / Call 911      Comments:   If you experience chest pain or shortness of breath, CALL 911 and be transported to the hospital emergency room.   If you develope a fever above 101 F, pus (white drainage) or increased drainage or redness at the wound, or calf pain, call your surgeon's office.   Discharge instructions      Comments:   Pick up stool softner and laxative for home. Do not submerge incision under water. May shower. Continue to  use ice for pain and swelling from surgery.  Take Xarelto for two and a half more weeks, then discontinue Xarelto. Once the patient has completed the Xarelto, they may resume the 81 mg Aspirin.   Constipation Prevention      Comments:   Drink plenty of fluids.  Prune juice may be helpful.  You may use a stool softener, such as Colace (over the counter) 100 mg twice a day.  Use MiraLax (over the counter) for constipation as needed.   Increase activity slowly as tolerated      Patient may shower      Comments:   You may shower without a dressing once there is no drainage.  Do not wash over the wound.  If drainage remains, do not shower until drainage stops.   Driving restrictions      Comments:   No driving until released by the physician.   Lifting restrictions      Comments:   No lifting until released by the physician.   TED hose      Comments:   Use stockings (TED hose) for 3 weeks on both leg(s).  You may remove them at night for sleeping.   Change dressing      Comments:   Change dressing daily with sterile 4 x 4 inch gauze dressing and apply TED hose. Do not submerge the incision under water.   Do not put a pillow under the knee. Place it under the heel.      Do not sit on low chairs, stoools or toilet seats, as it may be difficult to get up from low surfaces          Medication List     As of 11/25/2011  8:17 AM    STOP taking these medications         aspirin EC 81 MG tablet      Bromelains 500 MG Tabs      calcium carbonate 500 MG chewable tablet   Commonly known as: TUMS - dosed in mg elemental calcium      cholecalciferol 1000 UNITS tablet   Commonly known as: VITAMIN  D      MAGNESIUM CL-CALCIUM CARBONATE PO      OVER THE COUNTER MEDICATION      vitamin B-12 1000 MCG tablet   Commonly known as: CYANOCOBALAMIN      TAKE these medications         betamethasone dipropionate 0.05 % cream   Commonly known as: DIPROLENE   Apply 1 application topically 2 (two) times daily.      calcipotriene 0.005 % cream   Commonly known as: DOVONOX   Apply 1 application topically as needed. psoriasis      ferrous sulfate 75 (15 FE) MG/0.6ML drops   Commonly known as: FER-IN-SOL   Take 15 mg by mouth See admin instructions. 6 drops twice weekly      fluocinonide 0.05 % external solution   Commonly known as: LIDEX   Apply 1 application topically 2 (two) times a week.      methocarbamol 500 MG tablet   Commonly known as: ROBAXIN   Take 1 tablet (500 mg total) by mouth every 6 (six) hours as needed.      oxyCODONE 5 MG immediate release tablet   Commonly known as: Oxy IR/ROXICODONE   Take 1-2 tablets (5-10 mg total) by mouth every 3 (three) hours as needed for pain.      ranitidine 150 MG tablet  Commonly known as: ZANTAC   Take 150 mg by mouth as needed. Heartburn      rivaroxaban 10 MG Tabs tablet   Commonly known as: XARELTO   Take 1 tablet (10 mg total) by mouth daily with breakfast. Take Xarelto for two and a half more weeks, then discontinue Xarelto.  Once the patient has completed the Xarelto, they may resume the 81 mg Aspirin.           Follow-up Information    Follow up with Loanne Drilling, MD. In 2 weeks.   Contact information:   [facility] [address] [place] [address] [place] [address] [phone]          Signed: Patrica Duel 11/25/2011, 8:17 AM

## 2011-11-25 NOTE — Progress Notes (Signed)
Physical Therapy Treatment Patient Details Name: Deborah Kim MRN: [id number] DOB: 08-20-1934 Today's Date: 11/25/2011 Time: 1478-2956 PT Time Calculation (min): 25 min  PT Assessment / Plan / Recommendation Comments on Treatment Session  full ther ex deferred due to an abundance of staff in room and pt with difficulty focusing due to noise level    Follow Up Recommendations  Home health PT    Barriers to Discharge        Equipment Recommendations  Rolling walker with 5" wheels    Recommendations for Other Services    Frequency 7X/week   Plan Discharge plan remains appropriate;Frequency remains appropriate    Precautions / Restrictions Precautions Precautions: Knee Required Braces or Orthoses: Knee Immobilizer - Left Knee Immobilizer - Left: Discontinue once straight leg raise with < 10 degree lag Restrictions Weight Bearing Restrictions: No LLE Weight Bearing: Weight bearing as tolerated   Pertinent Vitals/Pain     Mobility  Bed Mobility Bed Mobility: Not assessed Supine to Sit: 5: Supervision;HOB elevated;With rails Details for Bed Mobility Assistance: Min cues for hand placement and use of RLE to self assist. Transfers Transfers: Sit to Stand;Stand to Sit Sit to Stand: 4: Min guard;From chair/3-in-1;With upper extremity assist Stand to Sit: 4: Min guard;With upper extremity assist;With armrests;To chair/3-in-1 Details for Transfer Assistance: Min cues for hand placement and safety. Ambulation/Gait Ambulation/Gait Assistance: 4: Min guard Ambulation Distance (Feet): 120 Feet Assistive device: Rolling walker Ambulation/Gait Assistance Details:   verbal cue for sequence and posture  Gait Pattern: Step-to pattern;Decreased stance time - left;Decreased stride length    Exercises Total Joint Exercises Ankle Circles/Pumps: AROM;Both;20 reps Quad Sets: AROM;Both;20 reps   PT Diagnosis:    PT Problem List:   PT Treatment Interventions:     PT Goals Acute Rehab PT  Goals Time For Goal Achievement: 12/01/11 Potential to Achieve Goals: Good Pt will go Sit to Stand: with supervision PT Goal: Sit to Stand - Progress: Progressing toward goal Pt will go Stand to Sit: with supervision PT Goal: Stand to Sit - Progress: Progressing toward goal Pt will Ambulate: 51 - 150 feet;with supervision;with least restrictive assistive device PT Goal: Ambulate - Progress: Progressing toward goal  Visit Information  Last PT Received On: 11/25/11 Assistance Needed: +1    Subjective Data  Subjective: I am doing ok   Cognition  Overall Cognitive Status: Appears within functional limits for tasks assessed/performed Arousal/Alertness: Awake/alert Orientation Level: Appears intact for tasks assessed Behavior During Session: [facility] for tasks performed    Balance     End of Session PT - End of Session Equipment Utilized During Treatment: Left knee immobilizer Activity Tolerance: Patient tolerated treatment well Patient left: in chair;with call bell/phone within reach;with nursing in room;with family/visitor present CPM Left Knee CPM Left Knee: Off   GP     [facility] 11/25/2011, 10:26 AM

## 2011-11-25 NOTE — Progress Notes (Signed)
   Subjective: 2 Days Post-Op Procedure(s) (LRB): TOTAL KNEE ARTHROPLASTY (Left) Patient reports pain as mild and moderate.  Doing better this morning. Patient seen in rounds with Dr. Lequita Halt. Patient is well, but has had some minor complaints of pain in the knee, requiring pain medications Patient is doing better and did fairly well with therapy yesterday afternoon. If she does well with both sessions today, possibility that she might be ready this evening. Will set up possible discharge.   Objective: Vital signs in last 24 hours: Temp:  [97.3 F (36.3 C)-99 F (37.2 C)] 98.9 F (37.2 C) (09/18 0512) Pulse Rate:  [72-104] 104  (09/18 0512) Resp:  [16] 16  (09/18 0512) BP: (123-130)/(62-76) 123/74 mmHg (09/18 0512) SpO2:  [94 %-100 %] 94 % (09/18 0512)  Intake/Output from previous day:  Intake/Output Summary (Last 24 hours) at 11/25/11 0807 Last data filed at 11/25/11 0513  Gross per 24 hour  Intake 1099.17 ml  Output   1800 ml  Net -700.83 ml    Intake/Output this shift:    Labs:  Basename 11/25/11 0427 11/24/11 0427  HGB 9.3* 10.1*    Basename 11/25/11 0427 11/24/11 0427  WBC 9.8 8.6  RBC 3.01* 3.33*  HCT 27.7* 31.3*  PLT 207 223    Basename 11/25/11 0427 11/24/11 0427  NA 126* 133*  K 3.6 3.8  CL 90* 99  CO2 28 29  BUN 6 6  CREATININE 0.44* 0.49*  GLUCOSE 166* 164*  CALCIUM 8.1* 7.9*   No results found for this basename: LABPT:2,INR:2 in the last 72 hours  EXAM: General - Patient is Alert, Appropriate and Oriented Extremity - Neurovascular intact Sensation intact distally Dorsiflexion/Plantar flexion intact No cellulitis present Incision - clean, dry, no drainage, healing Motor Function - intact, moving foot and toes well on exam.   Assessment/Plan: 2 Days Post-Op Procedure(s) (LRB): TOTAL KNEE ARTHROPLASTY (Left) Procedure(s) (LRB): TOTAL KNEE ARTHROPLASTY (Left) Past Medical History  Diagnosis Date  . Hyperlipidemia   . Arthritis   .  Psoriasis   . GERD (gastroesophageal reflux disease)   . Restless leg syndrome    Principal Problem:  *OA (osteoarthritis) of knee Active Problems:  Postop Hyponatremia  Postop Acute blood loss anemia   Advance diet Up with therapy Discharge home with home health Diet - Cardiac diet Follow up - in 2 weeks Activity - WBAT Disposition - Home Condition Upon Discharge - Pending at time of note. D/C Meds - See DC Summary DVT Prophylaxis - Xarelto, Aspirin 81 mg on hold for now.   PERKINS, ALEXZANDREW 11/25/2011, 8:07 AM

## 2011-11-25 NOTE — Evaluation (Signed)
Occupational Therapy Evaluation & Discharge Patient Details Name: Deborah Kim MRN: [id number] DOB: [birth year] Today's Date: 11/25/2011 Time: 4098-1191 OT Time Calculation (min): 20 min  OT Assessment / Plan / Recommendation Clinical Impression  Pt doing well POD 2 LTKR. All education completed and pt will have necessary level of A from family upon d/c. Recommend 3:1 for home use.    OT Assessment  Patient does not need any further OT services    Follow Up Recommendations  No OT follow up    Barriers to Discharge      Equipment Recommendations  3 in 1 bedside comode    Recommendations for Other Services    Frequency       Precautions / Restrictions Precautions Precautions: Knee Required Braces or Orthoses: Knee Immobilizer - Left Knee Immobilizer - Left: Discontinue once straight leg raise with < 10 degree lag Restrictions Weight Bearing Restrictions: No LLE Weight Bearing: Weight bearing as tolerated   Pertinent Vitals/Pain Pt reported 10/10 pain but did not appear to be in distress during tx. Repositioned and cold applied.    ADL  Grooming: Performed;Wash/dry hands;Min guard Where Assessed - Grooming: Supported standing Lower Body Dressing: Performed;Set up Where Assessed - Lower Body Dressing: Supported sit to stand Toilet Transfer: Performed;Min Pension scheme manager Method: Sit to Barista: Raised toilet seat with arms (or 3-in-1 over toilet) Toileting - Clothing Manipulation and Hygiene: Simulated;Min guard Where Assessed - Engineer, mining and Hygiene: Sit to stand from 3-in-1 or toilet Tub/Shower Transfer: Performed;Min guard Tub/Shower Transfer Method: Ambulating Equipment Used: Rolling walker;Gait belt ADL Comments: Pt was educated how to safely step into and out of the shower, don/doff pants and underwear. Pt is having no problems reaching her feet.    OT Diagnosis:    OT Problem List:   OT Treatment Interventions:       OT Goals    Visit Information  Last OT Received On: 11/25/11 Assistance Needed: +1    Subjective Data  Subjective: My pain is worse today. Patient Stated Goal: Hopefully go home today or tomorrow!   Prior Functioning  Vision/Perception  Home Living Lives With: Spouse Available Help at Discharge: Family Type of Home: House Home Access: Stairs to enter Secretary/administrator of Steps: 2 Entrance Stairs-Rails: None Home Layout: One level Bathroom Shower/Tub: Health visitor: Standard Home Adaptive Equipment: Shower chair without back;Walker - four wheeled Prior Function Level of Independence: Independent Able to Take Stairs?: Yes Vocation: Retired Musician: No difficulties      Cognition  Overall Cognitive Status: Appears within functional limits for tasks assessed/performed Arousal/Alertness: Awake/alert Orientation Level: Appears intact for tasks assessed Behavior During Session: [facility] for tasks performed    Extremity/Trunk Assessment Right Upper Extremity Assessment RUE ROM/Strength/Tone: [facility] for tasks assessed Left Upper Extremity Assessment LUE ROM/Strength/Tone: WFL for tasks assessed   Mobility  Shoulder Instructions  Bed Mobility Supine to Sit: 5: Supervision;HOB elevated;With rails Details for Bed Mobility Assistance: Min cues for hand placement and use of RLE to self assist. Transfers Sit to Stand: 4: Min guard;With upper extremity assist;With armrests;From bed;From chair/3-in-1 Stand to Sit: 4: Min guard;With upper extremity assist;With armrests;To chair/3-in-1 Details for Transfer Assistance: Min cues for hand placement and safety.       Exercise     Balance     End of Session OT - End of Session Equipment Utilized During Treatment: Gait belt Activity Tolerance: Patient tolerated treatment well Patient left: in chair;with call bell/phone within reach;with  family/visitor present CPM Left Knee CPM Left Knee:  Off  GO     Khadejah Son A OTR/L [id number] 11/25/2011, 9:55 AM

## 2011-11-25 NOTE — Progress Notes (Signed)
11/25/2011 Deborah Kim BSN CCM Pt planning to d/c tomorow with spouse as caregiver. [name] will provide [facility] services  with start of care day after discharge

## 2011-11-26 DIAGNOSIS — Z9289 Personal history of other medical treatment: Secondary | ICD-10-CM

## 2011-11-26 DIAGNOSIS — R000 Tachycardia, unspecified: Secondary | ICD-10-CM | POA: Diagnosis not present

## 2011-11-26 LAB — BASIC METABOLIC PANEL
CO2: 30 mEq/L (ref 19–32)
Calcium: 8.2 mg/dL — ABNORMAL LOW (ref 8.4–10.5)
Creatinine, Ser: 0.47 mg/dL — ABNORMAL LOW (ref 0.50–1.10)
GFR calc non Af Amer: >90 mL/min (ref >90)
Sodium: 132 mEq/L — ABNORMAL LOW (ref 135–145)

## 2011-11-26 LAB — CBC
MCH: 30.7 pg (ref 26.0–34.0)
MCHC: 33.6 g/dL (ref 30.0–36.0)
MCV: 91.4 fL (ref 78.0–100.0)
Platelets: 196 10*3/uL (ref 150–400)
RBC: 2.67 MIL/uL — ABNORMAL LOW (ref 3.87–5.11)

## 2011-11-26 MED ORDER — DIPHENHYDRAMINE HCL 25 MG PO CAPS: 25.0000 mg | ORAL_CAPSULE | Freq: Once | ORAL | Status: DC

## 2011-11-26 MED ORDER — ACETAMINOPHEN 325 MG PO TABS: 650.0000 mg | ORAL_TABLET | Freq: Once | ORAL | Status: DC

## 2011-11-26 NOTE — Progress Notes (Signed)
Pt to d/c home with home health. AVS reviewed and "My chart" discussed with pt with teach back. Pt capable of verbalizing medications and follow-up appointments with Dr. Lequita Halt. Remains hemodynamically stable. No signs and symptoms of distress. Educated pt to return to ER in the case of SOB, dizziness, or chest pain.

## 2011-11-26 NOTE — Progress Notes (Signed)
   Subjective: 3 Days Post-Op Procedure(s) (LRB): TOTAL KNEE ARTHROPLASTY (Left) Patient reports pain as mild.   Patient is having problems with tachycardia Plan is to go Home after hospital stay.  Objective: Vital signs in last 24 hours: Temp:  [98.4 F (36.9 C)-99.4 F (37.4 C)] 99.4 F (37.4 C) (09/18 2225) Pulse Rate:  [110-119] 119  (09/18 2225) Resp:  [16] 16  (09/18 2225) BP: (106-130)/(63-74) 106/63 mmHg (09/18 2225) SpO2:  [93 %-100 %] 95 % (09/18 2225)  Intake/Output from previous day:  Intake/Output Summary (Last 24 hours) at 11/26/11 0628 Last data filed at 11/25/11 2225  Gross per 24 hour  Intake   1245 ml  Output   1300 ml  Net    -55 ml    Intake/Output this shift: Total I/O In: 120 [P.O.:120] Out: 450 [Urine:450]  Labs:  [facility] 11/26/11 0411 11/25/11 1644 11/25/11 0427 11/24/11 0427  HGB 8.2* 9.1* 9.3* 10.1*    Basename 11/26/11 0411 11/25/11 1644  WBC 7.1 10.0  RBC 2.67* 2.99*  HCT 24.4* 27.3*  PLT 196 213    Basename 11/26/11 0411 11/25/11 0427  NA 132* 126*  K 3.5 3.6  CL 97 90*  CO2 30 28  BUN 7 6  CREATININE 0.47* 0.44*  GLUCOSE 123* 166*  CALCIUM 8.2* 8.1*   No results found for this basename: LABPT:2,INR:2 in the last 72 hours  EXAM General - Patient is Alert, Appropriate and Oriented Extremity - Neurologically intact Neurovascular intact Incision: dressing C/D/I No cellulitis present Compartment soft Dressing/Incision - clean, dry, no drainage Motor Function - intact, moving foot and toes well on exam.   Past Medical History  Diagnosis Date  . Hyperlipidemia   . Arthritis   . Psoriasis   . GERD (gastroesophageal reflux disease)   . Restless leg syndrome     Assessment/Plan: 3 Days Post-Op Procedure(s) (LRB): TOTAL KNEE ARTHROPLASTY (Left) Principal Problem:  *OA (osteoarthritis) of knee Active Problems:  Postop Hyponatremia  Postop Acute blood loss anemia   D/C IV fluids Discharge home with home health  once tachycardia resolves Will transfuse 2 units PRBCs this morning for acute symptomatic blood loss anemia and possibly discharge home later today if tachycardia resolves Has normal CXR and normal oxygen sats on room air so it is doubtful that an embolic event is causing the tachycardia.  DVT Prophylaxis - Xarelto Weight-Bearing as tolerated to left leg  Elya Tarquinio V 11/26/2011, 6:28 AM2

## 2011-11-26 NOTE — Progress Notes (Signed)
   Subjective: 3 Days Post-Op Procedure(s) (LRB): TOTAL KNEE ARTHROPLASTY (Left) Patient reports pain as mild.   Patient seen in rounds by Dr. Lequita Halt. HGB is down to 8.2. Pulse is still fast today. Will give blood and then discharge later this afternoon if does well. Patient is well, and has had no acute complaints or problems Patient will likely go home later today after the blood.  Objective: Vital signs in last 24 hours: Temp:  [98.4 F (36.9 C)-99.4 F (37.4 C)] 98.9 F (37.2 C) (09/19 1610) Pulse Rate:  [100-119] 100  (09/19 0638) Resp:  [16] 16  (09/19 0800) BP: (103-130)/(63-74) 103/63 mmHg (09/19 0638) SpO2:  [92 %-100 %] 92 % (09/19 9604)  Intake/Output from previous day:  Intake/Output Summary (Last 24 hours) at 11/26/11 0810 Last data filed at 11/26/11 5409  Gross per 24 hour  Intake   1125 ml  Output   1300 ml  Net   -175 ml    Intake/Output this shift:    Labs:  Basename 11/26/11 0411 11/25/11 1644 11/25/11 0427 11/24/11 0427  HGB 8.2* 9.1* 9.3* 10.1*    Basename 11/26/11 0411 11/25/11 1644  WBC 7.1 10.0  RBC 2.67* 2.99*  HCT 24.4* 27.3*  PLT 196 213    Basename 11/26/11 0411 11/25/11 0427  NA 132* 126*  K 3.5 3.6  CL 97 90*  CO2 30 28  BUN 7 6  CREATININE 0.47* 0.44*  GLUCOSE 123* 166*  CALCIUM 8.2* 8.1*   No results found for this basename: LABPT:2,INR:2 in the last 72 hours  EXAM: General - Patient is Alert, Appropriate and Oriented Extremity - Neurovascular intact Sensation intact distally Dorsiflexion/Plantar flexion intact No cellulitis present Incision - clean, dry, no drainage, healing Motor Function - intact, moving foot and toes well on exam.   Assessment/Plan: 3 Days Post-Op Procedure(s) (LRB): TOTAL KNEE ARTHROPLASTY (Left) Procedure(s) (LRB): TOTAL KNEE ARTHROPLASTY (Left) Past Medical History  Diagnosis Date  . Hyperlipidemia   . Arthritis   . Psoriasis   . GERD (gastroesophageal reflux disease)   . Restless leg  syndrome    Principal Problem:  *OA (osteoarthritis) of knee Active Problems:  Postop Hyponatremia  Postop Acute blood loss anemia  Postop Sinus tachycardia  Postop Transfusion   Discharge home with home health after transfusion Diet - Cardiac diet Follow up - in 2 weeks Activity - WBAT Disposition - Home Condition Upon Discharge - Stable, home if improved after blood. D/C Meds - See DC Summary DVT Prophylaxis - Xarelto, Aspirin 81 mg on hold for now.   PERKINS, ALEXZANDREW 11/26/2011, 8:10 AM

## 2011-11-26 NOTE — Progress Notes (Signed)
Utilization review completed.  

## 2011-11-26 NOTE — Progress Notes (Signed)
Patient evaluated for long-term disease management services with [facility] Care Management Program as benefit of her [facility] insurance. Patient will receive a post discharge transition of care call upon discharge. Explained [facility] Care Management services to both patient and husband. She will go home with [facility] upon discharge that has already been arranged by inpatient Case Manager. Left [facility] Care Management packet at bedside. Mrs [facility] appreciative of visit.  Raiford Noble, MSN-Ed, RN,BSN [facility] Liaison [phone]

## 2011-11-27 LAB — TYPE AND SCREEN
ABO/RH(D): AB POS
Unit division: 0
Unit division: 0

## 2011-11-27 NOTE — Progress Notes (Signed)
Discharge summary sent to payer through [facility]  

## 2011-12-15 ENCOUNTER — Ambulatory Visit: Payer: Medicare Other | Attending: Orthopedic Surgery | Admitting: Physical Therapy

## 2011-12-15 DIAGNOSIS — IMO0001 Reserved for inherently not codable concepts without codable children: Secondary | ICD-10-CM | POA: Insufficient documentation

## 2011-12-15 DIAGNOSIS — Z96659 Presence of unspecified artificial knee joint: Secondary | ICD-10-CM | POA: Insufficient documentation

## 2011-12-15 DIAGNOSIS — M25669 Stiffness of unspecified knee, not elsewhere classified: Secondary | ICD-10-CM | POA: Insufficient documentation

## 2011-12-15 DIAGNOSIS — M25569 Pain in unspecified knee: Secondary | ICD-10-CM | POA: Insufficient documentation

## 2011-12-17 ENCOUNTER — Ambulatory Visit: Payer: Medicare Other | Admitting: Physical Therapy

## 2011-12-21 ENCOUNTER — Ambulatory Visit: Payer: Medicare Other | Admitting: Physical Therapy

## 2011-12-23 ENCOUNTER — Ambulatory Visit: Payer: Medicare Other | Admitting: Physical Therapy

## 2011-12-25 ENCOUNTER — Ambulatory Visit: Payer: Medicare Other | Admitting: Physical Therapy

## 2011-12-28 ENCOUNTER — Ambulatory Visit: Payer: Medicare Other | Admitting: Physical Therapy

## 2011-12-30 ENCOUNTER — Ambulatory Visit: Payer: Medicare Other | Admitting: Physical Therapy

## 2012-01-01 ENCOUNTER — Ambulatory Visit: Payer: Medicare Other | Admitting: Physical Therapy

## 2012-01-04 ENCOUNTER — Ambulatory Visit: Payer: Medicare Other | Admitting: Physical Therapy

## 2012-01-06 ENCOUNTER — Ambulatory Visit: Payer: Medicare Other | Admitting: Physical Therapy

## 2012-01-08 ENCOUNTER — Ambulatory Visit: Payer: Medicare Other | Attending: Orthopedic Surgery | Admitting: Physical Therapy

## 2012-01-08 DIAGNOSIS — M25669 Stiffness of unspecified knee, not elsewhere classified: Secondary | ICD-10-CM | POA: Insufficient documentation

## 2012-01-08 DIAGNOSIS — Z96659 Presence of unspecified artificial knee joint: Secondary | ICD-10-CM | POA: Insufficient documentation

## 2012-01-08 DIAGNOSIS — M25569 Pain in unspecified knee: Secondary | ICD-10-CM | POA: Insufficient documentation

## 2012-01-08 DIAGNOSIS — IMO0001 Reserved for inherently not codable concepts without codable children: Secondary | ICD-10-CM | POA: Insufficient documentation

## 2012-01-11 ENCOUNTER — Ambulatory Visit: Payer: Medicare Other | Admitting: Physical Therapy

## 2012-01-14 ENCOUNTER — Ambulatory Visit: Payer: Medicare Other | Admitting: Physical Therapy

## 2015-06-20 DIAGNOSIS — E559 Vitamin D deficiency, unspecified: Secondary | ICD-10-CM | POA: Diagnosis not present

## 2015-06-20 DIAGNOSIS — K219 Gastro-esophageal reflux disease without esophagitis: Secondary | ICD-10-CM | POA: Diagnosis not present

## 2015-06-20 DIAGNOSIS — G259 Extrapyramidal and movement disorder, unspecified: Secondary | ICD-10-CM | POA: Diagnosis not present

## 2015-06-20 DIAGNOSIS — G479 Sleep disorder, unspecified: Secondary | ICD-10-CM | POA: Diagnosis not present

## 2015-06-20 DIAGNOSIS — E7800 Pure hypercholesterolemia, unspecified: Secondary | ICD-10-CM | POA: Diagnosis not present

## 2015-11-21 DIAGNOSIS — R300 Dysuria: Secondary | ICD-10-CM | POA: Diagnosis not present

## 2016-05-07 DIAGNOSIS — G479 Sleep disorder, unspecified: Secondary | ICD-10-CM | POA: Diagnosis not present

## 2016-05-07 DIAGNOSIS — E559 Vitamin D deficiency, unspecified: Secondary | ICD-10-CM | POA: Diagnosis not present

## 2016-05-07 DIAGNOSIS — E7800 Pure hypercholesterolemia, unspecified: Secondary | ICD-10-CM | POA: Diagnosis not present

## 2016-05-07 DIAGNOSIS — G259 Extrapyramidal and movement disorder, unspecified: Secondary | ICD-10-CM | POA: Diagnosis not present

## 2016-05-07 DIAGNOSIS — K219 Gastro-esophageal reflux disease without esophagitis: Secondary | ICD-10-CM | POA: Diagnosis not present

## 2016-09-08 DIAGNOSIS — R350 Frequency of micturition: Secondary | ICD-10-CM | POA: Diagnosis not present

## 2016-09-08 DIAGNOSIS — R300 Dysuria: Secondary | ICD-10-CM | POA: Diagnosis not present

## 2016-11-03 DIAGNOSIS — N390 Urinary tract infection, site not specified: Secondary | ICD-10-CM | POA: Diagnosis not present

## 2017-01-09 DIAGNOSIS — R350 Frequency of micturition: Secondary | ICD-10-CM | POA: Diagnosis not present

## 2017-04-07 DIAGNOSIS — J101 Influenza due to other identified influenza virus with other respiratory manifestations: Secondary | ICD-10-CM | POA: Diagnosis not present

## 2017-04-07 DIAGNOSIS — R05 Cough: Secondary | ICD-10-CM | POA: Diagnosis not present

## 2017-06-15 DIAGNOSIS — G479 Sleep disorder, unspecified: Secondary | ICD-10-CM | POA: Diagnosis not present

## 2017-06-15 DIAGNOSIS — E7800 Pure hypercholesterolemia, unspecified: Secondary | ICD-10-CM | POA: Diagnosis not present

## 2017-06-15 DIAGNOSIS — K219 Gastro-esophageal reflux disease without esophagitis: Secondary | ICD-10-CM | POA: Diagnosis not present

## 2017-06-15 DIAGNOSIS — G259 Extrapyramidal and movement disorder, unspecified: Secondary | ICD-10-CM | POA: Diagnosis not present

## 2017-06-15 DIAGNOSIS — E559 Vitamin D deficiency, unspecified: Secondary | ICD-10-CM | POA: Diagnosis not present

## 2018-02-15 DIAGNOSIS — G479 Sleep disorder, unspecified: Secondary | ICD-10-CM | POA: Diagnosis not present

## 2018-02-15 DIAGNOSIS — J301 Allergic rhinitis due to pollen: Secondary | ICD-10-CM | POA: Diagnosis not present

## 2018-09-23 DIAGNOSIS — E559 Vitamin D deficiency, unspecified: Secondary | ICD-10-CM | POA: Diagnosis not present

## 2018-09-23 DIAGNOSIS — K219 Gastro-esophageal reflux disease without esophagitis: Secondary | ICD-10-CM | POA: Diagnosis not present

## 2018-09-23 DIAGNOSIS — G479 Sleep disorder, unspecified: Secondary | ICD-10-CM | POA: Diagnosis not present

## 2018-09-23 DIAGNOSIS — J301 Allergic rhinitis due to pollen: Secondary | ICD-10-CM | POA: Diagnosis not present

## 2019-01-17 DIAGNOSIS — N3000 Acute cystitis without hematuria: Secondary | ICD-10-CM | POA: Diagnosis not present

## 2019-01-28 DIAGNOSIS — N390 Urinary tract infection, site not specified: Secondary | ICD-10-CM | POA: Diagnosis not present

## 2019-01-28 DIAGNOSIS — R350 Frequency of micturition: Secondary | ICD-10-CM | POA: Diagnosis not present

## 2019-06-26 DIAGNOSIS — R739 Hyperglycemia, unspecified: Secondary | ICD-10-CM | POA: Diagnosis not present

## 2019-06-26 DIAGNOSIS — G2581 Restless legs syndrome: Secondary | ICD-10-CM | POA: Diagnosis not present

## 2019-06-26 DIAGNOSIS — G479 Sleep disorder, unspecified: Secondary | ICD-10-CM | POA: Diagnosis not present

## 2019-06-26 DIAGNOSIS — J301 Allergic rhinitis due to pollen: Secondary | ICD-10-CM | POA: Diagnosis not present

## 2019-06-26 DIAGNOSIS — K219 Gastro-esophageal reflux disease without esophagitis: Secondary | ICD-10-CM | POA: Diagnosis not present

## 2019-06-26 DIAGNOSIS — Z5181 Encounter for therapeutic drug level monitoring: Secondary | ICD-10-CM | POA: Diagnosis not present

## 2019-06-26 DIAGNOSIS — E559 Vitamin D deficiency, unspecified: Secondary | ICD-10-CM | POA: Diagnosis not present

## 2019-07-31 DIAGNOSIS — Z85828 Personal history of other malignant neoplasm of skin: Secondary | ICD-10-CM | POA: Diagnosis not present

## 2019-07-31 DIAGNOSIS — L821 Other seborrheic keratosis: Secondary | ICD-10-CM | POA: Diagnosis not present

## 2019-07-31 DIAGNOSIS — L400 Psoriasis vulgaris: Secondary | ICD-10-CM | POA: Diagnosis not present

## 2019-08-14 DIAGNOSIS — N3000 Acute cystitis without hematuria: Secondary | ICD-10-CM | POA: Diagnosis not present

## 2019-09-05 DIAGNOSIS — R3989 Other symptoms and signs involving the genitourinary system: Secondary | ICD-10-CM | POA: Diagnosis not present

## 2019-12-29 DIAGNOSIS — G479 Sleep disorder, unspecified: Secondary | ICD-10-CM | POA: Diagnosis not present

## 2019-12-29 DIAGNOSIS — K219 Gastro-esophageal reflux disease without esophagitis: Secondary | ICD-10-CM | POA: Diagnosis not present

## 2019-12-29 DIAGNOSIS — J301 Allergic rhinitis due to pollen: Secondary | ICD-10-CM | POA: Diagnosis not present

## 2019-12-29 DIAGNOSIS — G2581 Restless legs syndrome: Secondary | ICD-10-CM | POA: Diagnosis not present

## 2019-12-29 DIAGNOSIS — R739 Hyperglycemia, unspecified: Secondary | ICD-10-CM | POA: Diagnosis not present

## 2019-12-29 DIAGNOSIS — Z5181 Encounter for therapeutic drug level monitoring: Secondary | ICD-10-CM | POA: Diagnosis not present

## 2019-12-29 DIAGNOSIS — E559 Vitamin D deficiency, unspecified: Secondary | ICD-10-CM | POA: Diagnosis not present

## 2020-06-28 DIAGNOSIS — E559 Vitamin D deficiency, unspecified: Secondary | ICD-10-CM | POA: Diagnosis not present

## 2020-06-28 DIAGNOSIS — G2581 Restless legs syndrome: Secondary | ICD-10-CM | POA: Diagnosis not present

## 2020-06-28 DIAGNOSIS — M25551 Pain in right hip: Secondary | ICD-10-CM | POA: Diagnosis not present

## 2020-06-28 DIAGNOSIS — J301 Allergic rhinitis due to pollen: Secondary | ICD-10-CM | POA: Diagnosis not present

## 2020-06-28 DIAGNOSIS — R739 Hyperglycemia, unspecified: Secondary | ICD-10-CM | POA: Diagnosis not present

## 2020-06-28 DIAGNOSIS — G479 Sleep disorder, unspecified: Secondary | ICD-10-CM | POA: Diagnosis not present

## 2020-06-28 DIAGNOSIS — K219 Gastro-esophageal reflux disease without esophagitis: Secondary | ICD-10-CM | POA: Diagnosis not present

## 2020-06-28 DIAGNOSIS — Z5181 Encounter for therapeutic drug level monitoring: Secondary | ICD-10-CM | POA: Diagnosis not present

## 2020-10-22 DIAGNOSIS — H52223 Regular astigmatism, bilateral: Secondary | ICD-10-CM | POA: Diagnosis not present

## 2020-10-22 DIAGNOSIS — H40053 Ocular hypertension, bilateral: Secondary | ICD-10-CM | POA: Diagnosis not present

## 2020-10-22 DIAGNOSIS — H5203 Hypermetropia, bilateral: Secondary | ICD-10-CM | POA: Diagnosis not present

## 2020-10-22 DIAGNOSIS — H524 Presbyopia: Secondary | ICD-10-CM | POA: Diagnosis not present

## 2020-10-22 DIAGNOSIS — H40023 Open angle with borderline findings, high risk, bilateral: Secondary | ICD-10-CM | POA: Diagnosis not present

## 2020-11-08 ENCOUNTER — Emergency Department (HOSPITAL_COMMUNITY): Payer: Medicare HMO

## 2020-11-08 ENCOUNTER — Emergency Department (HOSPITAL_COMMUNITY)
Admission: EM | Admit: 2020-11-08 | Discharge: 2020-11-09 | Disposition: A | Discharge status: Home / Self Care | Payer: Medicare HMO | Attending: Emergency Medicine | Admitting: Emergency Medicine

## 2020-11-08 ENCOUNTER — Other Ambulatory Visit: Payer: Self-pay

## 2020-11-08 DIAGNOSIS — W19XXXA Unspecified fall, initial encounter: Secondary | ICD-10-CM | POA: Diagnosis not present

## 2020-11-08 DIAGNOSIS — Z23 Encounter for immunization: Secondary | ICD-10-CM | POA: Diagnosis not present

## 2020-11-08 DIAGNOSIS — Z87891 Personal history of nicotine dependence: Secondary | ICD-10-CM | POA: Insufficient documentation

## 2020-11-08 DIAGNOSIS — S52532A Colles' fracture of left radius, initial encounter for closed fracture: Secondary | ICD-10-CM | POA: Diagnosis not present

## 2020-11-08 DIAGNOSIS — Z96652 Presence of left artificial knee joint: Secondary | ICD-10-CM | POA: Insufficient documentation

## 2020-11-08 DIAGNOSIS — M7989 Other specified soft tissue disorders: Secondary | ICD-10-CM | POA: Diagnosis not present

## 2020-11-08 DIAGNOSIS — Z7901 Long term (current) use of anticoagulants: Secondary | ICD-10-CM | POA: Diagnosis not present

## 2020-11-08 DIAGNOSIS — S6992XA Unspecified injury of left wrist, hand and finger(s), initial encounter: Secondary | ICD-10-CM | POA: Diagnosis not present

## 2020-11-08 MED ORDER — MORPHINE SULFATE 15 MG PO TABS: 7.5000 mg | ORAL_TABLET | ORAL | 0 refills | Status: DC | PRN

## 2020-11-08 MED ORDER — TETANUS-DIPHTH-ACELL PERTUSSIS 5-2.5-18.5 LF-MCG/0.5 IM SUSY
0.5000 mL | PREFILLED_SYRINGE | Freq: Once | INTRAMUSCULAR | Status: AC
  Administered 2020-11-08: 0.5 mL via INTRAMUSCULAR
  Filled 2020-11-08: qty 0.5

## 2020-11-08 NOTE — ED Provider Notes (Signed)
[facility]EMERGENCY DEPT Provider Note   CSN: [id number] Arrival date & time: 11/08/20  1927     History Chief Complaint  Patient presents with   Marletta Lor    Deborah Kim is a 86 y.o. female.  86 yo F with a chief complaint of a fall.  Patient states that she lost her footing on the driveway and fell onto an outstretched left hand.  Also struck her head.  Complaining of pain mostly to the left wrist.  She denies loss consciousness denies confusion denies nausea vomiting denies neck pain chest pain shortness of breath abdominal pain other extremity discomfort.  She was able to ambulate without issue.  Not on blood thinners.  The history is provided by the patient.  Fall This is a new problem. The current episode started yesterday. The problem occurs constantly. The problem has not changed since onset.Pertinent negatives include no chest pain, no headaches and no shortness of breath. Nothing aggravates the symptoms. Nothing relieves the symptoms. She has tried nothing for the symptoms. The treatment provided no relief.      Past Medical History:  Diagnosis Date   Arthritis    GERD (gastroesophageal reflux disease)    Hyperlipidemia    Psoriasis    Restless leg syndrome     Patient Active Problem List   Diagnosis Date Noted   Postop Sinus tachycardia 11/26/2011   Postop Transfusion 11/26/2011   Postop Hyponatremia 11/24/2011   Postop Acute blood loss anemia 11/24/2011   OA (osteoarthritis) of knee 11/23/2011    Past Surgical History:  Procedure Laterality Date   ABDOMINAL HYSTERECTOMY     APPENDECTOMY     TOTAL KNEE ARTHROPLASTY  11/23/2011   Procedure: TOTAL KNEE ARTHROPLASTY;  Surgeon: Loanne Drilling, MD;  Location: [facility] ORS;  Service: Orthopedics;  Laterality: Left;     OB History   No obstetric history on file.     No family history on file.  Social History   Tobacco Use   Smoking status: Former    Types: Cigarettes    Quit date: 11/15/1965     Years since quitting: 55.0  Substance Use Topics   Alcohol use: Yes    Comment: rare    Home Medications Prior to Admission medications   Medication Sig Start Date End Date Taking? Authorizing Provider  morphine (MSIR) 15 MG tablet Take 0.5 tablets (7.5 mg total) by mouth every 4 (four) hours as needed for severe pain. 11/08/20  Yes Melene Plan, DO  betamethasone dipropionate (DIPROLENE) 0.05 % cream Apply 1 application topically 2 (two) times daily.    [provider]  calcipotriene (DOVONOX) 0.005 % cream Apply 1 application topically as needed. psoriasis    [provider]  ferrous sulfate (FER-IN-SOL) 75 (15 FE) MG/0.6ML drops Take 15 mg by mouth See admin instructions. 6 drops twice weekly    [provider]  fluocinonide (LIDEX) 0.05 % external solution Apply 1 application topically 2 (two) times a week.    [provider]  methocarbamol (ROBAXIN) 500 MG tablet Take 1 tablet (500 mg total) by mouth every 6 (six) hours as needed. 11/25/11   Perkins, Alexzandrew L, PA-C  oxyCODONE (OXY IR/ROXICODONE) 5 MG immediate release tablet Take 1-2 tablets (5-10 mg total) by mouth every 3 (three) hours as needed for pain. 11/25/11   Perkins, Alexzandrew L, PA-C  ranitidine (ZANTAC) 150 MG tablet Take 150 mg by mouth as needed. Heartburn    [provider]  rivaroxaban Carlena Hurl)  10 MG TABS tablet Take 1 tablet (10 mg total) by mouth daily with breakfast. Take Xarelto for two and a half more weeks, then discontinue Xarelto. Once the patient has completed the Xarelto, they may resume the 81 mg Aspirin. 11/25/11   Perkins, Alexzandrew L, PA-C    Allergies    Patient has no known allergies.  Review of Systems   Review of Systems  Constitutional:  Negative for chills and fever.  HENT:  Negative for congestion and rhinorrhea.   Eyes:  Negative for redness and visual disturbance.  Respiratory:  Negative for shortness of breath and wheezing.   Cardiovascular:   Negative for chest pain and palpitations.  Gastrointestinal:  Negative for nausea and vomiting.  Genitourinary:  Negative for dysuria and urgency.  Musculoskeletal:  Positive for arthralgias. Negative for myalgias.  Skin:  Positive for wound. Negative for pallor.  Neurological:  Negative for dizziness and headaches.   Physical Exam Updated Vital Signs BP (!) 165/87   Pulse 91   Temp 98.6 F (37 C) (Oral)   Resp 17   Ht 5\' 5"  (1.651 m)   Wt 74.8 kg   SpO2 98%   BMI 27.46 kg/m   Physical Exam Vitals and nursing note reviewed.  Constitutional:      General: She is not in acute distress.    Appearance: She is well-developed. She is not diaphoretic.  HENT:     Head: Normocephalic.     Comments: Superficial abrasion to the left forehead just above the eyebrow.  No gaping no foreign bodies.  No midline C-spine tenderness able to rotate his head 45 degrees under direction without pain. Eyes:     Pupils: Pupils are equal, round, and reactive to light.  Cardiovascular:     Rate and Rhythm: Normal rate and regular rhythm.     Heart sounds: No murmur heard.   No friction rub. No gallop.  Pulmonary:     Effort: Pulmonary effort is normal.     Breath sounds: No wheezing or rales.  Abdominal:     General: There is no distension.     Palpations: Abdomen is soft.     Tenderness: There is no abdominal tenderness.  Musculoskeletal:        General: Tenderness present.     Cervical back: Normal range of motion and neck supple.     Comments: Pain and swelling to the left distal radius.  Pulse motor and sensation intact.  No pain at the elbow or the shoulder.  Palpated from head to toe without any other noted areas of bony tenderness.  Skin:    General: Skin is warm and dry.  Neurological:     Mental Status: She is alert and oriented to person, place, and time.  Psychiatric:        Behavior: Behavior normal.    ED Results / Procedures / Treatments   Labs (all labs ordered are  listed, but only abnormal results are displayed) Labs Reviewed - No data to display  EKG None  Radiology DG Wrist Complete Left  Result Date: 11/08/2020 CLINICAL DATA:  Left wrist swelling and pain.  Status post fall. EXAM: LEFT WRIST - COMPLETE 3+ VIEW COMPARISON:  None. FINDINGS: Comminuted and minimally displaced fracture of the distal radius. There is a transverse component through the metaphysis with extension into the radiocarpal joint. Minimal displacement. No associated ulnar styloid fracture. Normal carpal alignment. Chondrocalcinosis is noted at the triangular fibrocartilage. Carpal bones are intact. Soft tissue edema  at the fracture site. IMPRESSION: Comminuted and minimally displaced distal radius fracture extending into the radiocarpal joint. Electronically Signed   By: Narda Rutherford M.D.   On: 11/08/2020 20:46    Procedures Procedures   Medications Ordered in ED Medications  Tdap (BOOSTRIX) injection 0.5 mL (has no administration in time range)    ED Course  I have reviewed the triage vital signs and the nursing notes.  Pertinent labs & imaging results that were available during my care of the patient were reviewed by me and considered in my medical decision making (see chart for details).    MDM Rules/Calculators/A&P                           86 yo F left-handed with a chief complaints of left wrist pain.  Patient has a distal radius fracture with extension into the wrist.  Nondisplaced.  Not open.  Will splint in place.  Hand follow-up.  Patient is declining head CT.  I discussed the risk of this and she feels that the head injury was mild and imaging is not needed.  We will have her return for worsening pain confusion or vomiting.  10:06 PM:  I have discussed the diagnosis/risks/treatment options with the patient and believe the pt to be eligible for discharge home to follow-up with Ortho. We also discussed returning to the ED immediately if new or worsening sx  occur. We discussed the sx which are most concerning (e.g., sudden worsening pain, fever, inability to tolerate by mouth) that necessitate immediate return. Medications administered to the patient during their visit and any new prescriptions provided to the patient are listed below.  Medications given during this visit Medications  Tdap (BOOSTRIX) injection 0.5 mL (has no administration in time range)     The patient appears reasonably screen and/or stabilized for discharge and I doubt any other medical condition or other [facility] requiring further screening, evaluation, or treatment in the ED at this time prior to discharge.   Final Clinical Impression(s) / ED Diagnoses Final diagnoses:  Closed Colles' fracture of left radius, initial encounter    Rx / DC Orders ED Discharge Orders          Ordered    morphine (MSIR) 15 MG tablet  Every 4 hours PRN        11/08/20 2206             Melene Plan, DO 11/08/20 2206

## 2020-11-08 NOTE — Discharge Instructions (Addendum)
Take 4 over the counter ibuprofen tablets 3 times a day or 2 over-the-counter naproxen tablets twice a day for pain. Also take tylenol 1000mg (2 extra strength) four times a day.  Follow-up with a hand surgeon in the office.  Please return for worsening headache confusion or persistent vomiting.  Then take the pain medicine if you feel like you need it. Narcotics do not help with the pain, they only make you care about it less.  You can become addicted to this, people may break into your house to steal it.  It will constipate you.  If you drive under the influence of this medicine you can get a DUI.

## 2020-11-08 NOTE — ED Triage Notes (Signed)
Pt came in with c/o fall. She stumbled in the driveway. Pt has L wrist and hand pain. Pt did hit her head, but did not lose consciousness.

## 2020-11-08 NOTE — ED Notes (Signed)
Mc ortho tech called 

## 2020-11-09 NOTE — Progress Notes (Signed)
Orthopedic Tech Progress Note Patient Details:  Deborah Kim 06/20/1934 [id number]  Ortho Devices Type of Ortho Device: Sugartong splint, Shoulder immobilizer Ortho Device/Splint Location: LUE Ortho Device/Splint Interventions: Ordered, Application, Adjustment   Post Interventions Patient Tolerated: Well Instructions Provided: Adjustment of device, Care of device, Poper ambulation with device  Deborah Kim 11/09/2020, 2:26 AM

## 2020-11-09 NOTE — ED Notes (Signed)
Ortho tech at bedside to apply sugar tong wrist splint.

## 2020-11-14 DIAGNOSIS — M25532 Pain in left wrist: Secondary | ICD-10-CM | POA: Diagnosis not present

## 2020-11-14 DIAGNOSIS — S52562A Barton's fracture of left radius, initial encounter for closed fracture: Secondary | ICD-10-CM | POA: Diagnosis not present

## 2020-11-20 DIAGNOSIS — S52562A Barton's fracture of left radius, initial encounter for closed fracture: Secondary | ICD-10-CM | POA: Diagnosis not present

## 2020-11-20 DIAGNOSIS — Y999 Unspecified external cause status: Secondary | ICD-10-CM | POA: Diagnosis not present

## 2020-11-20 DIAGNOSIS — X58XXXA Exposure to other specified factors, initial encounter: Secondary | ICD-10-CM | POA: Diagnosis not present

## 2020-11-20 DIAGNOSIS — S52572A Other intraarticular fracture of lower end of left radius, initial encounter for closed fracture: Secondary | ICD-10-CM | POA: Diagnosis not present

## 2020-12-06 DIAGNOSIS — S52562A Barton's fracture of left radius, initial encounter for closed fracture: Secondary | ICD-10-CM | POA: Diagnosis not present

## 2020-12-18 ENCOUNTER — Other Ambulatory Visit: Payer: Self-pay | Admitting: Family Medicine

## 2020-12-18 DIAGNOSIS — Z13820 Encounter for screening for osteoporosis: Secondary | ICD-10-CM

## 2020-12-18 DIAGNOSIS — S62109S Fracture of unspecified carpal bone, unspecified wrist, sequela: Secondary | ICD-10-CM

## 2020-12-19 DIAGNOSIS — S52562A Barton's fracture of left radius, initial encounter for closed fracture: Secondary | ICD-10-CM | POA: Diagnosis not present

## 2020-12-19 DIAGNOSIS — M25532 Pain in left wrist: Secondary | ICD-10-CM | POA: Diagnosis not present

## 2020-12-26 DIAGNOSIS — M25532 Pain in left wrist: Secondary | ICD-10-CM | POA: Diagnosis not present

## 2021-01-23 DIAGNOSIS — S52562A Barton's fracture of left radius, initial encounter for closed fracture: Secondary | ICD-10-CM | POA: Diagnosis not present

## 2021-01-23 DIAGNOSIS — M25532 Pain in left wrist: Secondary | ICD-10-CM | POA: Diagnosis not present

## 2021-01-26 ENCOUNTER — Emergency Department (HOSPITAL_COMMUNITY): Payer: Medicare HMO

## 2021-01-26 ENCOUNTER — Inpatient Hospital Stay (HOSPITAL_COMMUNITY): Payer: Medicare HMO

## 2021-01-26 ENCOUNTER — Encounter (HOSPITAL_COMMUNITY): Payer: Self-pay | Admitting: Neurology

## 2021-01-26 ENCOUNTER — Inpatient Hospital Stay (HOSPITAL_COMMUNITY)
Admission: EM | Admit: 2021-01-26 | Discharge: 2021-01-27 | DRG: 065 | Disposition: A | Discharge status: Home / Self Care | Payer: Medicare HMO | Attending: Neurology | Admitting: Neurology

## 2021-01-26 DIAGNOSIS — L409 Psoriasis, unspecified: Secondary | ICD-10-CM | POA: Diagnosis present

## 2021-01-26 DIAGNOSIS — Z20822 Contact with and (suspected) exposure to covid-19: Secondary | ICD-10-CM | POA: Diagnosis not present

## 2021-01-26 DIAGNOSIS — Z96612 Presence of left artificial shoulder joint: Secondary | ICD-10-CM | POA: Diagnosis present

## 2021-01-26 DIAGNOSIS — I10 Essential (primary) hypertension: Secondary | ICD-10-CM | POA: Diagnosis not present

## 2021-01-26 DIAGNOSIS — Z7901 Long term (current) use of anticoagulants: Secondary | ICD-10-CM | POA: Diagnosis not present

## 2021-01-26 DIAGNOSIS — D6832 Hemorrhagic disorder due to extrinsic circulating anticoagulants: Secondary | ICD-10-CM | POA: Diagnosis present

## 2021-01-26 DIAGNOSIS — Z96652 Presence of left artificial knee joint: Secondary | ICD-10-CM | POA: Diagnosis present

## 2021-01-26 DIAGNOSIS — Z79899 Other long term (current) drug therapy: Secondary | ICD-10-CM | POA: Diagnosis not present

## 2021-01-26 DIAGNOSIS — Z7982 Long term (current) use of aspirin: Secondary | ICD-10-CM

## 2021-01-26 DIAGNOSIS — R471 Dysarthria and anarthria: Secondary | ICD-10-CM | POA: Diagnosis present

## 2021-01-26 DIAGNOSIS — G8191 Hemiplegia, unspecified affecting right dominant side: Secondary | ICD-10-CM | POA: Diagnosis not present

## 2021-01-26 DIAGNOSIS — I610 Nontraumatic intracerebral hemorrhage in hemisphere, subcortical: Principal | ICD-10-CM | POA: Diagnosis present

## 2021-01-26 DIAGNOSIS — I619 Nontraumatic intracerebral hemorrhage, unspecified: Secondary | ICD-10-CM | POA: Diagnosis present

## 2021-01-26 DIAGNOSIS — Z87891 Personal history of nicotine dependence: Secondary | ICD-10-CM

## 2021-01-26 DIAGNOSIS — Z8673 Personal history of transient ischemic attack (TIA), and cerebral infarction without residual deficits: Secondary | ICD-10-CM | POA: Diagnosis not present

## 2021-01-26 DIAGNOSIS — M1990 Unspecified osteoarthritis, unspecified site: Secondary | ICD-10-CM | POA: Diagnosis present

## 2021-01-26 DIAGNOSIS — I6389 Other cerebral infarction: Secondary | ICD-10-CM

## 2021-01-26 DIAGNOSIS — G2581 Restless legs syndrome: Secondary | ICD-10-CM | POA: Diagnosis present

## 2021-01-26 DIAGNOSIS — R29810 Facial weakness: Secondary | ICD-10-CM | POA: Diagnosis not present

## 2021-01-26 DIAGNOSIS — T45515A Adverse effect of anticoagulants, initial encounter: Secondary | ICD-10-CM | POA: Diagnosis present

## 2021-01-26 DIAGNOSIS — I618 Other nontraumatic intracerebral hemorrhage: Secondary | ICD-10-CM | POA: Diagnosis not present

## 2021-01-26 DIAGNOSIS — K219 Gastro-esophageal reflux disease without esophagitis: Secondary | ICD-10-CM | POA: Diagnosis present

## 2021-01-26 DIAGNOSIS — I639 Cerebral infarction, unspecified: Secondary | ICD-10-CM | POA: Diagnosis not present

## 2021-01-26 DIAGNOSIS — E785 Hyperlipidemia, unspecified: Secondary | ICD-10-CM | POA: Diagnosis present

## 2021-01-26 LAB — CBC
HCT: 43.4 % (ref 36.0–46.0)
Hemoglobin: 14.2 g/dL (ref 12.0–15.0)
MCH: 30.6 pg (ref 26.0–34.0)
MCHC: 32.7 g/dL (ref 30.0–36.0)
MCV: 93.5 fL (ref 80.0–100.0)
Platelets: 309 10*3/uL (ref 150–400)
RBC: 4.64 MIL/uL (ref 3.87–5.11)
RDW: 13.2 % (ref 11.5–15.5)
WBC: 8.1 10*3/uL (ref 4.0–10.5)
nRBC: 0 % (ref 0.0–0.2)

## 2021-01-26 LAB — DIFFERENTIAL
Abs Immature Granulocytes: 0.02 10*3/uL (ref 0.00–0.07)
Basophils Absolute: 0.1 10*3/uL (ref 0.0–0.1)
Basophils Relative: 1 %
Eosinophils Absolute: 0.2 10*3/uL (ref 0.0–0.5)
Eosinophils Relative: 3 %
Immature Granulocytes: 0 %
Lymphocytes Relative: 45 %
Lymphs Abs: 3.7 10*3/uL (ref 0.7–4.0)
Monocytes Absolute: 0.6 10*3/uL (ref 0.1–1.0)
Monocytes Relative: 7 %
Neutro Abs: 3.6 10*3/uL (ref 1.7–7.7)
Neutrophils Relative %: 44 %

## 2021-01-26 LAB — COMPREHENSIVE METABOLIC PANEL
ALT: 14 U/L (ref 0–44)
AST: 22 U/L (ref 15–41)
Albumin: 4 g/dL (ref 3.5–5.0)
Alkaline Phosphatase: 77 U/L (ref 38–126)
Anion gap: 9 (ref 5–15)
BUN: 12 mg/dL (ref 8–23)
CO2: 25 mmol/L (ref 22–32)
Calcium: 9.6 mg/dL (ref 8.9–10.3)
Chloride: 103 mmol/L (ref 98–111)
Creatinine, Ser: 0.62 mg/dL (ref 0.44–1.00)
GFR, Estimated: >60 mL/min (ref >60)
Glucose, Bld: 108 mg/dL — ABNORMAL HIGH (ref 70–99)
Potassium: 4 mmol/L (ref 3.5–5.1)
Sodium: 137 mmol/L (ref 135–145)
Total Bilirubin: 0.7 mg/dL (ref 0.3–1.2)
Total Protein: 6.3 g/dL — ABNORMAL LOW (ref 6.5–8.1)

## 2021-01-26 LAB — LIPID PANEL
Cholesterol: 258 mg/dL — ABNORMAL HIGH (ref 0–200)
HDL: 62 mg/dL (ref >40)
LDL Cholesterol: 182 mg/dL — ABNORMAL HIGH (ref 0–99)
Total CHOL/HDL Ratio: 4.2 RATIO
Triglycerides: 68 mg/dL (ref <150)
VLDL: 14 mg/dL (ref 0–40)

## 2021-01-26 LAB — ECHOCARDIOGRAM COMPLETE
Area-P 1/2: 2.69 cm2
Calc EF: 68.5 %
Height: 65 in
S' Lateral: 2.2 cm
Single Plane A2C EF: 68.7 %
Single Plane A4C EF: 67 %
Weight: 2571.45 oz

## 2021-01-26 LAB — I-STAT CHEM 8, ED
BUN: 12 mg/dL (ref 8–23)
Calcium, Ion: 1.18 mmol/L (ref 1.15–1.40)
Chloride: 101 mmol/L (ref 98–111)
Creatinine, Ser: 0.6 mg/dL (ref 0.44–1.00)
Glucose, Bld: 107 mg/dL — ABNORMAL HIGH (ref 70–99)
HCT: 44 % (ref 36.0–46.0)
Hemoglobin: 15 g/dL (ref 12.0–15.0)
Potassium: 3.6 mmol/L (ref 3.5–5.1)
Sodium: 139 mmol/L (ref 135–145)
TCO2: 25 mmol/L (ref 22–32)

## 2021-01-26 LAB — CBG MONITORING, ED: Glucose-Capillary: 112 mg/dL — ABNORMAL HIGH (ref 70–99)

## 2021-01-26 LAB — RESP PANEL BY RT-PCR (FLU A&B, COVID) ARPGX2
Influenza A by PCR: NEGATIVE
Influenza B by PCR: NEGATIVE
SARS Coronavirus 2 by RT PCR: NEGATIVE

## 2021-01-26 LAB — PROTIME-INR
INR: 1 (ref 0.8–1.2)
Prothrombin Time: 12.8 seconds (ref 11.4–15.2)

## 2021-01-26 LAB — APTT: aPTT: 34 seconds (ref 24–36)

## 2021-01-26 LAB — ETHANOL: Alcohol, Ethyl (B): <10 mg/dL (ref <10)

## 2021-01-26 LAB — MRSA NEXT GEN BY PCR, NASAL: MRSA by PCR Next Gen: NOT DETECTED

## 2021-01-26 LAB — HEMOGLOBIN A1C
Hgb A1c MFr Bld: 5.6 % (ref 4.8–5.6)
Mean Plasma Glucose: 114.02 mg/dL

## 2021-01-26 MED ORDER — CLEVIDIPINE BUTYRATE 0.5 MG/ML IV EMUL
INTRAVENOUS | Status: AC
  Administered 2021-01-26: 2 mg/h via INTRAVENOUS
  Filled 2021-01-26: qty 50

## 2021-01-26 MED ORDER — CHLORHEXIDINE GLUCONATE CLOTH 2 % EX PADS
6.0000 | MEDICATED_PAD | Freq: Every day | CUTANEOUS | Status: DC
  Administered 2021-01-26: 6 via TOPICAL

## 2021-01-26 MED ORDER — STROKE: EARLY STAGES OF RECOVERY BOOK
Freq: Once | Status: AC
  Filled 2021-01-26: qty 1

## 2021-01-26 MED ORDER — CLEVIDIPINE BUTYRATE 0.5 MG/ML IV EMUL
0.0000 mg/h | INTRAVENOUS | Status: DC
  Administered 2021-01-26: 2 mg/h via INTRAVENOUS
  Filled 2021-01-26: qty 50

## 2021-01-26 MED ORDER — SODIUM CHLORIDE 0.9 % IV SOLN: INTRAVENOUS | Status: AC

## 2021-01-26 MED ORDER — HYDRALAZINE HCL 20 MG/ML IJ SOLN
5.0000 mg | INTRAMUSCULAR | Status: DC | PRN
Start: 2021-01-26 — End: 2021-01-27
  Filled 2021-01-26: qty 1

## 2021-01-26 MED ORDER — ATORVASTATIN CALCIUM 80 MG PO TABS
80.0000 mg | ORAL_TABLET | Freq: Every day | ORAL | Status: DC
  Administered 2021-01-26 – 2021-01-27 (×2): 80 mg via ORAL
  Filled 2021-01-26 (×2): qty 1

## 2021-01-26 MED ORDER — ACETAMINOPHEN 325 MG PO TABS: 650.0000 mg | ORAL_TABLET | ORAL | Status: DC | PRN

## 2021-01-26 MED ORDER — ACETAMINOPHEN 160 MG/5ML PO SOLN: 650.0000 mg | ORAL | Status: DC | PRN

## 2021-01-26 MED ORDER — ACETAMINOPHEN 650 MG RE SUPP: 650.0000 mg | RECTAL | Status: DC | PRN

## 2021-01-26 MED ORDER — SENNOSIDES-DOCUSATE SODIUM 8.6-50 MG PO TABS
1.0000 | ORAL_TABLET | Freq: Two times a day (BID) | ORAL | Status: DC
  Administered 2021-01-26 (×3): 1 via ORAL
  Filled 2021-01-26 (×4): qty 1

## 2021-01-26 MED ORDER — PANTOPRAZOLE SODIUM 40 MG IV SOLR
40.0000 mg | Freq: Every day | INTRAVENOUS | Status: DC
  Administered 2021-01-26: 40 mg via INTRAVENOUS
  Filled 2021-01-26 (×2): qty 40

## 2021-01-26 NOTE — ED Provider Notes (Signed)
[facility] EMERGENCY DEPARTMENT Provider Note  CSN: [id number] Arrival date & time: 01/26/21 0037  Chief Complaint(s) Code Stroke  HPI Deborah Kim is a 86 y.o. female here for right sided deficits noted after waking up around MN to use the restroom. She got up and fell. Her husband woke up to help her up. Noted facial droop and right sided weakness. LKW 2130p when she went to bed. No alleviating or aggravating factors. No prior h/o stroke. No AC. No associated cp or sob. No N/V.  Came in by POV. Initiated code stroke during triage.  HPI   Past Medical History Past Medical History:  Diagnosis Date   Arthritis    GERD (gastroesophageal reflux disease)    Hyperlipidemia    Psoriasis    Restless leg syndrome    Patient Active Problem List   Diagnosis Date Noted   ICH (intracerebral hemorrhage) (HCC) 01/26/2021   Postop Sinus tachycardia 11/26/2011   Postop Transfusion 11/26/2011   Postop Hyponatremia 11/24/2011   Postop Acute blood loss anemia 11/24/2011   OA (osteoarthritis) of knee 11/23/2011   Home Medication(s) Prior to Admission medications   Medication Sig Start Date End Date Taking? Authorizing Provider  betamethasone dipropionate (DIPROLENE) 0.05 % cream Apply 1 application topically 2 (two) times daily.    [provider]  calcipotriene (DOVONOX) 0.005 % cream Apply 1 application topically as needed. psoriasis    [provider]  ferrous sulfate (FER-IN-SOL) 75 (15 FE) MG/0.6ML drops Take 15 mg by mouth See admin instructions. 6 drops twice weekly    [provider]  fluocinonide (LIDEX) 0.05 % external solution Apply 1 application topically 2 (two) times a week.    [provider]  methocarbamol (ROBAXIN) 500 MG tablet Take 1 tablet (500 mg total) by mouth every 6 (six) hours as needed. 11/25/11   Perkins, Alexzandrew L, PA-C  morphine (MSIR) 15 MG tablet Take 0.5 tablets (7.5 mg total) by mouth every 4 (four)  hours as needed for severe pain. 11/08/20   Melene Plan, DO  oxyCODONE (OXY IR/ROXICODONE) 5 MG immediate release tablet Take 1-2 tablets (5-10 mg total) by mouth every 3 (three) hours as needed for pain. 11/25/11   Perkins, Alexzandrew L, PA-C  ranitidine (ZANTAC) 150 MG tablet Take 150 mg by mouth as needed. Heartburn    [provider]  rivaroxaban (XARELTO) 10 MG TABS tablet Take 1 tablet (10 mg total) by mouth daily with breakfast. Take Xarelto for two and a half more weeks, then discontinue Xarelto. Once the patient has completed the Xarelto, they may resume the 81 mg Aspirin. 11/25/11   Lauraine Rinne, PA-C                                                                                                                                    Past Surgical History Past Surgical History:  Procedure Laterality Date   ABDOMINAL  HYSTERECTOMY     APPENDECTOMY     TOTAL KNEE ARTHROPLASTY  11/23/2011   Procedure: TOTAL KNEE ARTHROPLASTY;  Surgeon: Loanne Drilling, MD;  Location: [facility];  Service: Orthopedics;  Laterality: Left;   Family History No family history on file.  Social History Social History   Tobacco Use   Smoking status: Former    Types: Cigarettes    Quit date: 11/15/1965    Years since quitting: 55.2  Substance Use Topics   Alcohol use: Yes    Comment: rare   Allergies Patient has no known allergies.  Review of Systems Review of Systems All other systems are reviewed and are negative for acute change except as noted in the HPI  Physical Exam Vital Signs  I have reviewed the triage vital signs BP 138/66   Pulse 71   Temp 98.3 F (36.8 C) (Oral)   Resp 18   Wt 72.9 kg   SpO2 98%   BMI 26.74 kg/m   Physical Exam Vitals reviewed.  Constitutional:      General: She is not in acute distress.    Appearance: She is well-developed. She is not diaphoretic.  HENT:     Head: Normocephalic and atraumatic.     Nose: Nose normal.  Eyes:     General: No  scleral icterus.       Right eye: No discharge.        Left eye: No discharge.     Conjunctiva/sclera: Conjunctivae normal.     Pupils: Pupils are equal, round, and reactive to light.  Cardiovascular:     Rate and Rhythm: Normal rate and regular rhythm.     Heart sounds: No murmur heard.   No friction rub. No gallop.  Pulmonary:     Effort: Pulmonary effort is normal. No respiratory distress.     Breath sounds: Normal breath sounds. No stridor. No rales.  Abdominal:     General: There is no distension.     Palpations: Abdomen is soft.     Tenderness: There is no abdominal tenderness.  Musculoskeletal:        General: No tenderness.     Cervical back: Normal range of motion and neck supple.  Skin:    General: Skin is warm and dry.     Findings: No erythema or rash.  Neurological:     Mental Status: She is alert and oriented to person, place, and time.     Comments: Right facial droop sparing forehead. Right UE weakness with pronation and drift. Deferred rest of exam.    ED Results and Treatments Labs (all labs ordered are listed, but only abnormal results are displayed) Labs Reviewed  COMPREHENSIVE METABOLIC PANEL - Abnormal; Notable for the following components:      Result Value   Glucose, Bld 108 (*)    Total Protein 6.3 (*)    All other components within normal limits  CBG MONITORING, ED - Abnormal; Notable for the following components:   Glucose-Capillary 112 (*)    All other components within normal limits  I-STAT CHEM 8, ED - Abnormal; Notable for the following components:   Glucose, Bld 107 (*)    All other components within normal limits  RESP PANEL BY RT-PCR (FLU A&B, COVID) ARPGX2  ETHANOL  PROTIME-INR  APTT  CBC  DIFFERENTIAL  RAPID URINE DRUG SCREEN, HOSP PERFORMED  URINALYSIS, ROUTINE W REFLEX MICROSCOPIC  EKG  EKG  Interpretation  Date/Time:  Sunday January 26 2021 01:08:22 EST Ventricular Rate:  72 PR Interval:  157 QRS Duration: 117 QT Interval:  427 QTC Calculation: 468 R Axis:   -7 Text Interpretation: Sinus rhythm Incomplete right bundle branch block Consider anterior infarct No acute changes Confirmed by Drema Pry [id number]) on 01/26/2021 2:13:35 AM       Radiology CT HEAD CODE STROKE WO CONTRAST  Addendum Date: 01/26/2021   ADDENDUM REPORT: 01/26/2021 01:22 ADDENDUM: Critical Value/emergent results were called by telephone at the time of interpretation on 01/26/2021 at 1:14 am to provider Caryl Pina, who verbally acknowledged these results. Electronically Signed   By: Deatra Robinson M.D.   On: 01/26/2021 01:22   Result Date: 01/26/2021 CLINICAL DATA:  Code stroke. EXAM: CT HEAD WITHOUT CONTRAST TECHNIQUE: Contiguous axial images were obtained from the base of the skull through the vertex without intravenous contrast. COMPARISON:  None. FINDINGS: Brain: There is an acute intraparenchymal hematoma in the left basal ganglia that measures 2.5 x 1.5 x 2.1 cm (volume = 4.1 cm^3) There is periventricular hypoattenuation compatible with chronic microvascular disease. Vascular: No abnormal hyperdensity of the major intracranial arteries or dural venous sinuses. No intracranial atherosclerosis. Skull: The visualized skull base, calvarium and extracranial soft tissues are normal. Sinuses/Orbits: No fluid levels or advanced mucosal thickening of the visualized paranasal sinuses. No mastoid or middle ear effusion. The orbits are normal. IMPRESSION: 1. Acute intraparenchymal hematoma in the left basal ganglia, measuring 4.1 mL. No mass effect. 2. Chronic microvascular ischemia. Electronically Signed: By: Deatra Robinson M.D. On: 01/26/2021 01:07    Pertinent labs & imaging results that were available during my care of the patient were reviewed by me and considered in my medical decision making (see MDM for  details).  Medications Ordered in ED Medications  clevidipine (CLEVIPREX) infusion 0.5 mg/mL (2 mg/hr Intravenous New Bag/Given 01/26/21 0115)   stroke: mapping our early stages of recovery book (has no administration in time range)  acetaminophen (TYLENOL) tablet 650 mg (has no administration in time range)    Or  acetaminophen (TYLENOL) 160 MG/5ML solution 650 mg (has no administration in time range)    Or  acetaminophen (TYLENOL) suppository 650 mg (has no administration in time range)  senna-docusate (Senokot-S) tablet 1 tablet (has no administration in time range)  pantoprazole (PROTONIX) injection 40 mg (has no administration in time range)  0.9 %  sodium chloride infusion (has no administration in time range)                                                                                                                                     Procedures .1-3 Lead EKG Interpretation Performed by: Nira Conn, MD Authorized by: Nira Conn, MD     Interpretation: normal     ECG rate:  75   ECG rate assessment: normal  Rhythm: sinus rhythm     Ectopy: none     Conduction: normal   .Critical Care Performed by: Nira Conn, MD Authorized by: Nira Conn, MD   Critical care provider statement:    Critical care time (minutes):  45   Critical care time was exclusive of:  Separately billable procedures and treating other patients   Critical care was necessary to treat or prevent imminent or life-threatening deterioration of the following conditions:  CNS failure or compromise   Critical care was time spent personally by me on the following activities:  Development of treatment plan with patient or surrogate, discussions with consultants, evaluation of patient's response to treatment, examination of patient, obtaining history from patient or surrogate, review of old charts, re-evaluation of patient's condition, pulse oximetry, ordering and  review of radiographic studies, ordering and review of laboratory studies and ordering and performing treatments and interventions   Care discussed with: admitting provider    (including critical care time)  Medical Decision Making / ED Course I have reviewed the nursing notes for this encounter and the patient's prior records (if available in EHR or on provided paperwork).  Deborah Kim was evaluated in Emergency Department on 01/26/2021 for the symptoms described in the history of present illness. She was evaluated in the context of the global COVID-19 pandemic, which necessitated consideration that the patient might be at risk for infection with the SARS-CoV-2 virus that causes COVID-19. Institutional protocols and algorithms that pertain to the evaluation of patients at risk for COVID-19 are in a state of rapid change based on information released by regulatory bodies including the CDC and federal and state organizations. These policies and algorithms were followed during the patient's care in the ED.     Code stroke activated upon arrival. Take to CT Noted basal ganglion hemorrhagic stroke on left. Initial sBP 170. Placed on Cleviprex. Labs reassuring Admitted to neuro ICU  Pertinent labs & imaging results that were available during my care of the patient were reviewed by me and considered in my medical decision making:    Final Clinical Impression(s) / ED Diagnoses Final diagnoses:  Hemorrhagic stroke [facility])     This chart was dictated using voice recognition software.  Despite best efforts to proofread,  errors can occur which can change the documentation meaning.    Nira Conn, MD 01/26/21 [phone]

## 2021-01-26 NOTE — Progress Notes (Addendum)
STROKE TEAM PROGRESS NOTE   SUBJECTIVE (INTERVAL HISTORY) Her husband and daughter are at the bedside.  Patient returned from MRI and is resting comfortably in bed. She is on cleveprex and her blood pressure is controlled. All question answered. Patient is awaiting PT/OT/ST evaluation.  MRI scan of the brain shows left basal ganglia stable hemorrhage there is also diffusion changes raising question of hemorrhagic infarct rather than parenchymal hemorrhage. Blood pressure adequately controlled.  Neurological exam stable OBJECTIVE Vitals:   01/26/21 1230 01/26/21 1245 01/26/21 1300 01/26/21 1315  BP: (!) 141/80 139/79 (!) 145/87 123/86  Pulse: 72 71 82 79  Resp: 20 18 19  (!) 30  Temp:      TempSrc:      SpO2: 96% 96% 95% 97%  Weight:      Height:        CBC:  Recent Labs  Lab 01/26/21 0049 01/26/21 0050  WBC  --  8.1  NEUTROABS  --  3.6  HGB 15.0 14.2  HCT 44.0 43.4  MCV  --  93.5  PLT  --  309    Basic Metabolic Panel:  Recent Labs  Lab 01/26/21 0049 01/26/21 0050  NA 139 137  K 3.6 4.0  CL 101 103  CO2  --  25  GLUCOSE 107* 108*  BUN 12 12  CREATININE 0.60 0.62  CALCIUM  --  9.6    Lipid Panel: No results for input(s): CHOL, TRIG, HDL, CHOLHDL, VLDL, LDLCALC in the last 168 hours. HgbA1c:  Lab Results  Component Value Date   HGBA1C 5.6 01/26/2021   Urine Drug Screen: No results found for: LABOPIA, COCAINSCRNUR, LABBENZ, AMPHETMU, THCU, LABBARB  Alcohol Level     Component Value Date/Time   ETH <10 01/26/2021 0050    IMAGING  Results for orders placed or performed during the hospital encounter of 01/26/21  MR BRAIN WO CONTRAST   Narrative   CLINICAL DATA:  86 year old female code stroke presentation with acute left lentiform hemorrhage.  EXAM: MRI HEAD WITHOUT CONTRAST  TECHNIQUE: Multiplanar, multiecho pulse sequences of the brain and surrounding structures were obtained without intravenous contrast.  COMPARISON:  Head CT 0052 hours  today.  FINDINGS: Brain: Biconvex hemorrhage at the left lentiform encompasses 29 by 17 x 25 mm (AP by transverse by CC) for an estimated blood volume of 6 mL. Blood products are T2 hyperintense, T1 isointense. Surrounding edema. Mild regional mass effect.  No extra-axial or intraventricular extension of blood identified. Multiple chronic microhemorrhages in the contralateral right deep gray matter nuclei.  Susceptibility artifact related to the blood products. No other abnormal diffusion. No other hemosiderin identified. No cortical encephalomalacia. But moderate widely scattered patchy and confluent bilateral cerebral white matter T2 and FLAIR hyperintensity. No midline shift, mass effect, evidence of mass lesion, ventriculomegaly. Cervicomedullary junction and pituitary are within normal limits.  Vascular: Major intracranial vascular flow voids are preserved, with mild generalized intracranial artery tortuosity.  Skull and upper cervical spine: Normal bone marrow signal. Negative visible cervical spine.  Sinuses/Orbits: Postoperative changes to both globes, otherwise negative orbits. Paranasal Visualized paranasal sinuses and mastoids are clear.  Other: Visible internal auditory structures appear normal. Negative visible scalp and face.  IMPRESSION: 1. Acute hemorrhage in the left lentiform with estimated blood volume of 6 mL. Mild regional edema and mass effect. No extra-axial or intraventricular extension of blood.  2. No other acute intracranial abnormality, but chronic microhemorrhages in the contralateral right deep gray matter nuclei, and moderate cerebral white  matter signal changes compatible with chronic small vessel disease.   Electronically Signed   By: Genevie Ann M.D.   On: 01/26/2021 10:56        PHYSICAL EXAM  Physical Exam  Constitutional: Appears well-developed and well-nourished pleasant elderly Caucasian lady.  Psych: Affect appropriate to  situation Eyes: No scleral injection HENT: No OP obstrucion MSK: no joint deformities.  Cardiovascular: Normal rate and regular rhythm. Normotensive  Respiratory: Effort normal, non-labored breathing GI: Soft.  No distension. There is no tenderness.  Skin: WDI  Neuro: Mental Status: Patient is awake, alert, oriented to person, place, month, year, and situation. Patient is able to give a coherent history. Mild dysarthria and no signs of aphasia or neglect  Cranial Nerves: II: Visual Fields are full. Pupils are equal, round, and reactive to light.   III,IV, VI: EOMI without ptosis or diploplia.  V: Facial sensation is symmetric to temperature VII: Facial movement is asymmetric with slight right nasolabial asymmetry VIII: Hearing is intact to voice X: Palate elevates symmetrically XI: Shoulder shrug is symmetric. XII: Tongue protrudes midline without atrophy or fasciculations.   Motor: Tone is normal. Bulk is normal. 5/5 strength was present left upper and lower extremities. Strength 4/5 in RUE and 5/5 in RLE. Fine motor deficit in right hand and leg.  Diminished fine finger movements on the right or with left shoulder replacement 3..  Sensory: Sensation is symmetric to light touch and temperature in the arms and legs. No extinction to DSS present.  Deep Tendon Reflexes: 2+ and symmetric in the biceps and patellae.  Plantars: Toes are downgoing bilaterally.  Cerebellar: FNF and HKS are intact bilaterally  NIHSS components Score: Comment  1a Level of Conscious 0[x]  1[]  2[]  3[]         1b LOC Questions 0[x]  1[]  2[]           1c LOC Commands 0[x]  1[]  2[]           2 Best Gaze 0[x]  1[]  2[]           3 Visual 0[x]  1[]  2[]  3[]         4 Facial Palsy 0[]  1[x]  2[]  3[]         5a Motor Arm - left 0[x]  1[]  2[]  3[]  4[]  UN[]     5b Motor Arm - Right 0[]  1[x]  2[]  3[]  4[]  UN[]     6a Motor Leg - Left 0[x]  1[]  2[]  3[]  4[]  UN[]     6b Motor Leg - Right 0[x]  1[]  2[]  3[]  4[]  UN[]     7 Limb Ataxia 0[x]   1[]  2[]  3[]  UN[]       8 Sensory 0[x]  1[]  2[]  UN[]         9 Best Language 0[x]  1[]  2[]  3[]         10 Dysarthria 0[]  1[x]  2[]  UN[]         11 Extinct. and Inattention 0[x]  1[]  2[]           TOTAL: 3        ASSESSMENT/PLAN [name] is a 86 y.o. female with history of of HLD, psoriasis, arthritis and RLS presenting from home with new onset of right facial droop, RUE weakness and slurred speech. She awoke at 12 midnight and was able to walk normally to the bathroom. After going to the bathroom her leg gave way and she had a controlled fall without striking her head.   LKW: 2130  Stroke:  acute hemorrhage in the left lentiform, presumably secondary to hypertension.  Hemorrhagic infarct less  likely with possible CT head- code stroke CT- acute intraparenchymal hematoma in the left basal ganglia measuring 4.51ml MRI head- 11/20: Acute hemorrhage in the left lentiform with estimated blood volume of 6 mL. Mild regional edema and mass effect. No extra-axial or intraventricular extension of blood. 2. No other acute intracranial abnormality, but chronic microhemorrhages in the contralateral right deep gray matter nuclei, and moderate cerebral white matter signal changes compatible with chronic small vessel disease. MRA head  Carotid Doppler   2D Echo  Pending LDL 182 HgbA1c 5.6 SCDs for VTE prophylaxis aspirin 81 mg daily prior to admission, now on No antithrombotic.  Patient counseled to be compliant with her antithrombotic medications Ongoing aggressive stroke risk factor management Therapy recommendations:  Pending Disposition:  Pending  Hypertension Stable  Tight blood pressure control for 24 hours (bp goal 130-150)  Long-term BP goal normotensive Cleviprex infusion  PRN hydralazine IVP q4 hours  Hyperlipidemia Home meds:  None LDL 182, goal < 70 Add atorvastatin 80mg  daily Continue statin at discharge  Diabetes type II HgbA1c 5.6, goal < 7.0 Controlled  Other Stroke Risk  Factors Advanced age Obesity, Body mass index is 26.74 kg/m., recommend weight loss, diet and exercise as appropriate  Hx stroke/TIA Coronary artery disease  Other Active Problems Psoriasis Insomnia Arthritis GERD Restless Leg Syndrome  Hospital day # 0   Pt seen by NP/Neuro and later by MD. Note/plan to be edited by MD as needed.  Janine Ores, FNP-BC [facility]  STROKE MD NOTE :I have personally obtained history,examined this patient, reviewed notes, independently viewed imaging studies, participated in medical decision making and plan of care.ROS completed by me personally and pertinent positives fully documented  I have made any additions or clarifications directly to the above note. Agree with note above.  Patient presented with dysarthria and slurred speech due to left small basal ganglia hemorrhage etiology possibly hypertensive or even though she gives no history of hypertension blood pressure has been mildly elevated since admission.  Recommend close neurological follow-up and strict blood pressure control with systolic [id number] range for first 24 hours and then below 160.  Use as needed IV labetalol and hydralazine and wean Cleviprex drip.  Mobilize out of bed.  Therapy consults.  Check echocardiogram, lipid profile hemoglobin A1c.  Long discussion with patient, husband and daughter answered questions.This patient is critically ill and at significant risk of neurological worsening, death and care requires constant monitoring of vital signs, hemodynamics,respiratory and cardiac monitoring, extensive review of multiple databases, frequent neurological assessment, discussion with family, other specialists and medical decision making of high complexity.I have made any additions or clarifications directly to the above note.This critical care time does not reflect procedure time, or teaching time or supervisory time of PA/NP/Med Resident etc but could involve care discussion  time.  I spent 35 minutes of neurocritical care time  in the care of  this patient.   Antony Contras, MD Medical Director [facility] Pager: [phone] 01/26/2021 2:16 PM   To contact Stroke Continuity provider, please refer to [email or web address] After hours, contact General Neurology

## 2021-01-26 NOTE — Progress Notes (Signed)
  Echocardiogram 2D Echocardiogram has been performed.  Deborah Kim 01/26/2021, 2:52 PM

## 2021-01-26 NOTE — Progress Notes (Signed)
PT Cancellation Note  Patient Details Name: Deborah Kim MRN: [id number] DOB: [birth year]   Cancelled Treatment:    Reason Eval/Treat Not Completed: Active bedrest order  Lillia Pauls, PT, DPT Acute Rehabilitation Services Pager [phone] Office [phone]    Deborah Kim 01/26/2021, 7:40 AM

## 2021-01-26 NOTE — Code Documentation (Addendum)
Stroke Response Nurse Documentation Code Documentation  Deborah Kim is a 86 y.o. female arriving to [facility] ED via Private Vehicle on 11/20 with past medical hx of HLD, GERD. On ASA 81 mg daily. Code stroke was activated by ED.   Patient from home where she was LKW at 2130 and now complaining of right facial droop and right sided weakness.   Stroke team at the bedside on patient arrival. Labs drawn and patient cleared for CT by Dr. Eudelia Bunch. Patient to CT with team. NIHSS 3, see documentation for details and code stroke times. Patient with right facial droop, right limb ataxia, and dysarthria  on exam. The following imaging was completed:  CT. Patient is not a candidate for IV Thrombolytic due to hemorrhage.   Bedside handoff with ED RN Pattricia Boss.    Rose Fillers  Rapid Response RN

## 2021-01-26 NOTE — H&P (Signed)
NEURO HOSPITALIST CONSULT NOTE   Requestig physician: Dr. Eudelia Bunch  Reason for Consult: Acute onset of right sided weakness  History obtained from:  Patient, Husband and Chart     HPI:                                                                                                                                          Deborah Kim is an 86 y.o. female with a PMHx of HLD, psoriasis, arthritis and RLS presenting from home with new onset of right facial droop, RUE weakness and slurred speech. LKN was 2130 when she went to bed. She awake at 12 midnight and was able to walk normally to the bathroom. After going to the bathroom her leg gave way and she had a controlled fall without striking her head. Code Stroke was activated on arrival to the ED.   CT head reveals an acute left basal ganglia hemorrhage which appears most likely to be hypertensive and is approximately 4 cc in size.   Past Medical History:  Diagnosis Date   Arthritis    GERD (gastroesophageal reflux disease)    Hyperlipidemia    Psoriasis    Restless leg syndrome     Past Surgical History:  Procedure Laterality Date   ABDOMINAL HYSTERECTOMY     APPENDECTOMY     TOTAL KNEE ARTHROPLASTY  11/23/2011   Procedure: TOTAL KNEE ARTHROPLASTY;  Surgeon: Loanne Drilling, MD;  Location: [facility];  Service: Orthopedics;  Laterality: Left;    Social History:  reports that she quit smoking about 55 years ago. She does not have any smokeless tobacco history on file. She reports current alcohol use. No history on file for drug use.  No Known Allergies  MEDICATIONS:                                                                                                                     Docusate Percocet 5/325 PRN Ambien 10 mg qhs PRN Robaxin 500 mg q6h PRN ASA 81 mg po qd   ROS:  No  fever, chills, nausea, vomiting, diarrhea, CP, SOB, confusion, headache or left sided symptoms. Other ROS as per HPI.   BP (!) 151/100   Pulse 73   Temp 98.3 F (36.8 C) (Oral)   Resp (!) 21   Wt 72.9 kg   SpO2 99%   BMI 26.74 kg/m    General Examination:                                                                                                       Physical Exam  HEENT-  [place]/AT    Heart: NSR Lungs- Respirations unlabored. Abdomen- Nondistended Extremities- No edema  Neurological Examination Mental Status: Awake and alert. Oriented x 5. Speech is dysarthric but fluent with intact comprehension and naming. Able to follow all commands.  Cranial Nerves: II: Temporal visual fields intact with no extinction to DSS. PERRL  III,IV, VI: No ptosis. EOMI with saccadic quality of visual pursuits noted, worse towards the left.  No nystagmus.  V: Sensation intact to temp bilaterally VII: Right facial droop.  VIII: Hearing intact to voice IX,X: No hypophonia XI: Decreased movement with shoulder shrug on the right XII: Midline tongue extension Motor: RUE and RLE 4/5 with mild bradykinesia.  Positive orbiting fingers test on the right.  Subtle drift on the right LUE and LLE 5/5 Sensory: Temp and light touch intact throughout, bilaterally. No extinction to DSS.  Deep Tendon Reflexes: 2+ and symmetric throughout Plantars: Right: downgoing   Left: downgoing Cerebellar: No ataxia with FNF bilaterally, but slow on the right. Gait: Deferred   Lab Results: Basic Metabolic Panel: Recent Labs  Lab 01/26/21 0049  NA 139  K 3.6  CL 101  GLUCOSE 107*  BUN 12  CREATININE 0.60    CBC: Recent Labs  Lab 01/26/21 0049  HGB 15.0  HCT 44.0    Cardiac Enzymes: No results for input(s): CKTOTAL, CKMB, CKMBINDEX, TROPONINI in the last 168 hours.  Lipid Panel: No results for input(s): CHOL, TRIG, HDL, CHOLHDL, VLDL, LDLCALC in the last 168 hours.  Imaging: No results  found.   Assessment: 86 year old female with acute left basal ganglia hemorrhage.  1. Exam reveals findings referable to the left basal ganglia lesion 2. CT head:  Acute intraparenchymal hematoma in the left basal ganglia, measuring 4.1 mL. No mass effect. Chronic microvascular ischemia 3. Most likely etiology for the hemorrhage is HTN 4. Med list in Epic includes Xarelto taper. This was from 2013. No longer on a blood thinner.   Recommendations: 1. Admit to ICU under Neurology service 2. MRI of head 3. Carotid ultrasound 4. TTE 5. PT consult, OT consult, Speech consult 6. Cardiac telemetry 7. Frequent neuro checks 8. Stop home ASA. No antiplatelet medications or anticoagulants  9. IV NS 10. BP management with clevidipine drip  11. DVT prophylaxis with SCDs     Electronically signed: Dr. Caryl Pina 01/26/2021, 12:53 AM

## 2021-01-26 NOTE — Progress Notes (Signed)
OT Cancellation Note  Patient Details Name: Deborah Kim MRN: [id number] DOB: June 05, 1934   Cancelled Treatment:    Reason Eval/Treat Not Completed: Active bedrest order  Evette Georges 01/26/2021, 7:49 AM

## 2021-01-26 NOTE — ED Triage Notes (Addendum)
Patient presents with right facial droop /slurred speech and right arm weakness onset 12 midnight , went to bed at approx. 9:30 pm , Code stroke activated , EDP notified and evaluated at arrival .

## 2021-01-27 DIAGNOSIS — K219 Gastro-esophageal reflux disease without esophagitis: Secondary | ICD-10-CM | POA: Insufficient documentation

## 2021-01-27 DIAGNOSIS — E7800 Pure hypercholesterolemia, unspecified: Secondary | ICD-10-CM | POA: Insufficient documentation

## 2021-01-27 MED ORDER — ACETAMINOPHEN 325 MG PO TABS: 650.0000 mg | ORAL_TABLET | ORAL | 0 refills | Status: DC | PRN

## 2021-01-27 MED ORDER — ATORVASTATIN CALCIUM 80 MG PO TABS: 80.0000 mg | ORAL_TABLET | Freq: Every day | ORAL | 0 refills | Status: DC

## 2021-01-27 MED ORDER — AMLODIPINE BESYLATE 2.5 MG PO TABS: 2.5000 mg | ORAL_TABLET | Freq: Every day | ORAL | Status: DC

## 2021-01-27 MED ORDER — PANTOPRAZOLE SODIUM 40 MG PO TBEC: 40.0000 mg | DELAYED_RELEASE_TABLET | Freq: Every day | ORAL | 0 refills | Status: DC

## 2021-01-27 MED ORDER — SENNOSIDES-DOCUSATE SODIUM 8.6-50 MG PO TABS: 1.0000 | ORAL_TABLET | Freq: Two times a day (BID) | ORAL | 0 refills | Status: DC

## 2021-01-27 MED ORDER — AMLODIPINE BESYLATE 2.5 MG PO TABS: 2.5000 mg | ORAL_TABLET | Freq: Every day | ORAL | 0 refills | Status: AC

## 2021-01-27 MED ORDER — PANTOPRAZOLE SODIUM 40 MG PO TBEC: 40.0000 mg | DELAYED_RELEASE_TABLET | Freq: Every day | ORAL | Status: DC

## 2021-01-27 NOTE — Evaluation (Signed)
Occupational Therapy Evaluation Patient Details Name: Deborah Kim MRN: [id number] DOB: 1934/09/07 Today's Date: 01/27/2021   History of Present Illness 86 yo female presents [facility]/21 with R facial droop, slurred speech, RUE weakness. CT head reveals an acute left basal ganglia hemorrhage which appears most likely to be hypertensive and is approximately 4 cc in size, MRI shows L BG stable hemorrhage, questionable hemorrhagic infarct rather than parenchymal hemorrhage. PMH includes HLD, psoriasis, OA, RLS, L TKR 2013.   Clinical Impression   PTA pt lives independently with her husband. Able to mobilize and complete ADL tasks with Min A due ot below listed deficits.  Recommend follow up with OT at the neuro outpt center. Discussed DC recommendations with family, including initial need for direct S with mobility and ADL/IADL tasks and family agreed that could provide this level of supervision. Will follow acutely. VSS during session.     Recommendations for follow up therapy are one component of a multi-disciplinary discharge planning process, led by the attending physician.  Recommendations may be updated based on patient status, additional functional criteria and insurance authorization.   Follow Up Recommendations  Outpatient OT (neuro outpt)    Assistance Recommended at Discharge Frequent or constant Supervision/Assistance (S with mobility and ADL/IADL initially; refrain from driving)  Functional Status Assessment  Patient has had a recent decline in their functional status and demonstrates the ability to make significant improvements in function in a reasonable and predictable amount of time.  Equipment Recommendations  None recommended by OT    Recommendations for Other Services PT consult;Speech consult     Precautions / Restrictions Precautions Precautions: Fall      Mobility Bed Mobility Overal bed mobility: Modified Independent                   Transfers Overall transfer level: Needs assistance Equipment used: 1 person hand held assist Transfers: Sit to/from Stand;Bed to chair/wheelchair/BSC Sit to Stand: Min assist; initial posterior bias; "I haven't been out of bed" Stand pivot transfers: Min assist         General transfer comment: unsteady; improved as mobility progressed      Balance Overall balance assessment: Needs assistance;History of Falls   Sitting balance-Leahy Scale: Good       Standing balance-Leahy Scale: Poor; requires support at this time; had fall at home PTA                             ADL either performed or assessed with clinical judgement   ADL Overall ADL's : Needs assistance/impaired Eating/Feeding: Set up   Grooming: Set up   Upper Body Bathing: Set up   Lower Body Bathing: Minimal assistance;Sit to/from stand   Upper Body Dressing : Minimal assistance   Lower Body Dressing: Minimal assistance Lower Body Dressing Details (indicate cue type and reason): difficulty donning socks Toilet Transfer: Minimal assistance;Ambulation           Functional mobility during ADLs: Minimal assistance (initially unsteady)       Vision Baseline Vision/History: 1 Wears glasses Patient Visual Report: No change from baseline Additional Comments: appears intact; will further assess     Perception Perception Comments: WFL   Praxis      Pertinent Vitals/Pain Pain Assessment: No/denies pain     Hand Dominance  (ambidextriuus)   Extremity/Trunk Assessment Upper Extremity Assessment Upper Extremity Assessment: RUE deficits/detail RUE Deficits / Details: ROM/strength overall WFL; impaired sensory  motor planning - "clumsy hand" RUE Sensation: decreased proprioception RUE Coordination: decreased fine motor;decreased gross motor   Lower Extremity Assessment Lower Extremity Assessment: Defer to PT evaluation   Cervical / Trunk Assessment Cervical / Trunk Assessment: Normal    Communication Communication Communication: Expressive difficulties   Cognition Arousal/Alertness: Awake/alert Behavior During Therapy: WFL for tasks assessed/performed Overall Cognitive Status: Within Functional Limits for tasks assessed (appears close to baseline; will further assess); noted slower processing, however daughter states her Mom is generally close to baseline; appropriately joking with this therapist                                       General Comments       Exercises Exercises: Other exercises Other Exercises Other Exercises: encouraged increased attntion to R hand - looking at hand; incorporating in movement   Shoulder Instructions      Home Living Family/patient expects to be discharged to:: Private residence Living Arrangements: Spouse/significant other;Children Available Help at Discharge: Available 24 hours/day Type of Home: House Home Access: Stairs to enter [employer] of Steps: 2 Entrance Stairs-Rails: Right;Left Home Layout: One level     Bathroom Shower/Tub: Producer, television/film/video: Standard Bathroom Accessibility: Yes How Accessible: Accessible via walker Home Equipment: Shower seat - built in          Prior Functioning/Environment Prior Level of Function : Independent/Modified Independent               ADLs Comments: very active; drives        OT Problem List: Decreased strength;Decreased activity tolerance;Impaired balance (sitting and/or standing);Decreased coordination;Decreased safety awareness;Decreased knowledge of use of DME or AE;Impaired UE functional use      OT Treatment/Interventions: Self-care/ADL training;Therapeutic exercise;Neuromuscular education;DME and/or AE instruction;Therapeutic activities;Patient/family education;Balance training    OT Goals(Current goals can be found in the care plan section) Acute Rehab OT Goals Patient Stated Goal: to go home adn resume  activities OT Goal Formulation: With patient/family Time For Goal Achievement: 02/10/21 Potential to Achieve Goals: Good  OT Frequency: Min 2X/week   Barriers to D/C:            Co-evaluation              AM-PAC OT "6 Clicks" Daily Activity     Outcome Measure Help from another person eating meals?: A Little Help from another person taking care of personal grooming?: A Little Help from another person toileting, which includes using toliet, bedpan, or urinal?: A Little Help from another person bathing (including washing, rinsing, drying)?: A Little Help from another person to put on and taking off regular upper body clothing?: A Little Help from another person to put on and taking off regular lower body clothing?: A Little 6 Click Score: 18   End of Session Equipment Utilized During Treatment: Gait belt Nurse Communication: Mobility status  Activity Tolerance: Patient tolerated treatment well Patient left: in chair;with call bell/phone within reach;with chair alarm set  OT Visit Diagnosis: Unsteadiness on feet (R26.81);History of falling (Z91.81);Muscle weakness (generalized) (M62.81);Apraxia (R48.2)                Time: 6659-9357 OT Time Calculation (min): 28 min Charges:  OT General Charges $OT Visit: 1 Visit OT Evaluation $OT Eval Moderate Complexity: 1 Mod OT Treatments $Self Care/Home Management : 8-22 mins  Luisa Dago, OT/L   Acute OT Clinical Specialist  Acute Rehabilitation Services Pager [phone] Office [phone]   [facility] 01/27/2021, 10:39 AM

## 2021-01-27 NOTE — Discharge Summary (Addendum)
Stroke Discharge Summary  Patient ID: Deborah Kim   MRN: [id number]      DOB: 06-19-1934  Date of Admission: 01/26/2021 Date of Discharge: 01/27/2021  Attending Physician:  Stroke, Md, MD, Stroke MD Consultant(s):    None  Patient's PCP:  Ileana Ladd, MD  DISCHARGE DIAGNOSIS: Acute left lentiform hemorrhage presumably secondary to hypertension Principal Problem:   ICH (intracerebral hemorrhage) (HCC) Dysarthria Facial weakness   Allergies as of 01/27/2021   No Known Allergies      Medication List     STOP taking these medications    aspirin EC 81 MG tablet   methocarbamol 500 MG tablet Commonly known as: ROBAXIN   morphine 15 MG tablet Commonly known as: MSIR   oxyCODONE 5 MG immediate release tablet Commonly known as: Oxy IR/ROXICODONE   rivaroxaban 10 MG Tabs tablet Commonly known as: XARELTO       TAKE these medications    acetaminophen 325 MG tablet Commonly known as: TYLENOL Take 2 tablets (650 mg total) by mouth every 4 (four) hours as needed for mild pain (or temp > 37.5 C (99.5 F)).   amLODipine 2.5 MG tablet Commonly known as: NORVASC Take 1 tablet (2.5 mg total) by mouth daily.   APPLE CIDER VINEGAR PO Take 1 tablet by mouth daily.   atorvastatin 80 MG tablet Commonly known as: LIPITOR Take 1 tablet (80 mg total) by mouth daily. Start taking on: January 28, 2021   calcipotriene 0.005 % cream Commonly known as: DOVONOX Apply 1 application topically 2 (two) times daily as needed (psoriasis).   CALCIUM PO Take 1 tablet by mouth daily.   pantoprazole 40 MG tablet Commonly known as: PROTONIX Take 1 tablet (40 mg total) by mouth at bedtime.   senna-docusate 8.6-50 MG tablet Commonly known as: Senokot-S Take 1 tablet by mouth 2 (two) times daily.   VITAMIN D PO Take 1 tablet by mouth daily.   zolpidem 10 MG tablet Commonly known as: AMBIEN Take 10 mg by mouth at bedtime as needed for sleep.        LABORATORY  STUDIES CBC    Component Value Date/Time   WBC 8.1 01/26/2021 0050   RBC 4.64 01/26/2021 0050   HGB 14.2 01/26/2021 0050   HCT 43.4 01/26/2021 0050   PLT 309 01/26/2021 0050   MCV 93.5 01/26/2021 0050   MCH 30.6 01/26/2021 0050   MCHC 32.7 01/26/2021 0050   RDW 13.2 01/26/2021 0050   LYMPHSABS 3.7 01/26/2021 0050   MONOABS 0.6 01/26/2021 0050   EOSABS 0.2 01/26/2021 0050   BASOSABS 0.1 01/26/2021 0050   CMP    Component Value Date/Time   NA 137 01/26/2021 0050   K 4.0 01/26/2021 0050   CL 103 01/26/2021 0050   CO2 25 01/26/2021 0050   GLUCOSE 108 (H) 01/26/2021 0050   BUN 12 01/26/2021 0050   CREATININE 0.62 01/26/2021 0050   CALCIUM 9.6 01/26/2021 0050   PROT 6.3 (L) 01/26/2021 0050   ALBUMIN 4.0 01/26/2021 0050   AST 22 01/26/2021 0050   ALT 14 01/26/2021 0050   ALKPHOS 77 01/26/2021 0050   BILITOT 0.7 01/26/2021 0050   GFRNONAA >60 01/26/2021 0050   GFRAA >90 11/26/2011 0411   COAGS Lab Results  Component Value Date   INR 1.0 01/26/2021   INR 0.99 11/16/2011   Lipid Panel    Component Value Date/Time   CHOL 258 (H) 01/26/2021 1217   TRIG 68 01/26/2021 1217  HDL 62 01/26/2021 1217   CHOLHDL 4.2 01/26/2021 1217   VLDL 14 01/26/2021 1217   LDLCALC 182 (H) 01/26/2021 1217   HgbA1C  Lab Results  Component Value Date   HGBA1C 5.6 01/26/2021   Urinalysis    Component Value Date/Time   COLORURINE YELLOW 11/16/2011 1230   APPEARANCEUR CLEAR 11/16/2011 1230   LABSPEC 1.014 11/16/2011 1230   PHURINE 7.0 11/16/2011 1230   GLUCOSEU NEGATIVE 11/16/2011 1230   HGBUR NEGATIVE 11/16/2011 1230   BILIRUBINUR NEGATIVE 11/16/2011 1230   KETONESUR NEGATIVE 11/16/2011 1230   PROTEINUR NEGATIVE 11/16/2011 1230   UROBILINOGEN 0.2 11/16/2011 1230   NITRITE NEGATIVE 11/16/2011 1230   LEUKOCYTESUR NEGATIVE 11/16/2011 1230   Urine Drug Screen No results found for: LABOPIA, COCAINSCRNUR, LABBENZ, AMPHETMU, THCU, LABBARB  Alcohol Level    Component Value Date/Time    ETH <10 01/26/2021 0050     SIGNIFICANT DIAGNOSTIC STUDIES MR BRAIN WO CONTRAST  Result Date: 01/26/2021 CLINICAL DATA:  86 year old female code stroke presentation with acute left lentiform hemorrhage. EXAM: MRI HEAD WITHOUT CONTRAST TECHNIQUE: Multiplanar, multiecho pulse sequences of the brain and surrounding structures were obtained without intravenous contrast. COMPARISON:  Head CT 0052 hours today. FINDINGS: Brain: Biconvex hemorrhage at the left lentiform encompasses 29 by 17 x 25 mm (AP by transverse by CC) for an estimated blood volume of 6 mL. Blood products are T2 hyperintense, T1 isointense. Surrounding edema. Mild regional mass effect. No extra-axial or intraventricular extension of blood identified. Multiple chronic microhemorrhages in the contralateral right deep gray matter nuclei. Susceptibility artifact related to the blood products. No other abnormal diffusion. No other hemosiderin identified. No cortical encephalomalacia. But moderate widely scattered patchy and confluent bilateral cerebral white matter T2 and FLAIR hyperintensity. No midline shift, mass effect, evidence of mass lesion, ventriculomegaly. Cervicomedullary junction and pituitary are within normal limits. Vascular: Major intracranial vascular flow voids are preserved, with mild generalized intracranial artery tortuosity. Skull and upper cervical spine: Normal bone marrow signal. Negative visible cervical spine. Sinuses/Orbits: Postoperative changes to both globes, otherwise negative orbits. Paranasal Visualized paranasal sinuses and mastoids are clear. Other: Visible internal auditory structures appear normal. Negative visible scalp and face. IMPRESSION: 1. Acute hemorrhage in the left lentiform with estimated blood volume of 6 mL. Mild regional edema and mass effect. No extra-axial or intraventricular extension of blood. 2. No other acute intracranial abnormality, but chronic microhemorrhages in the contralateral right  deep gray matter nuclei, and moderate cerebral white matter signal changes compatible with chronic small vessel disease. Electronically Signed   By: Odessa Fleming M.D.   On: 01/26/2021 10:56   ECHOCARDIOGRAM COMPLETE  Result Date: 01/26/2021    ECHOCARDIOGRAM REPORT   Patient Name:   Deborah Kim Date of Exam: 01/26/2021 Medical Rec #:  [id number]    Height:       65.0 in Accession #:    [id number]   Weight:       160.7 lb Date of Birth:  [birth year]    BSA:          1.803 m Patient Age:    86 years     BP:           118/77 mmHg Patient Gender: F            HR:           67 bpm. Exam Location:  Inpatient Procedure: 2D Echo, 3D Echo, Cardiac Doppler and Color Doppler Indications:    Stroke  History:  Patient has no prior history of Echocardiogram examinations.                 Abnormal ECG; Arrythmias:Tachycardia.  Sonographer:    Sheralyn Boatman RDCS Referring Phys: [id number] ERIC LINDZEN  Sonographer Comments: Technically difficult study due to poor echo windows. IMPRESSIONS  1. Left ventricular ejection fraction, by estimation, is 60 to 65%. The left ventricle has normal function. The left ventricle has no regional wall motion abnormalities. Left ventricular diastolic parameters are consistent with Grade I diastolic dysfunction (impaired relaxation). Elevated left atrial pressure.  2. Right ventricular systolic function is normal. The right ventricular size is normal. Tricuspid regurgitation signal is inadequate for assessing PA pressure.  3. The mitral valve is normal in structure. Trivial mitral valve regurgitation. No evidence of mitral stenosis. Moderate mitral annular calcification.  4. The aortic valve is tricuspid. Aortic valve regurgitation is not visualized. Aortic valve sclerosis/calcification is present, without any evidence of aortic stenosis.  5. The inferior vena cava is normal in size with greater than 50% respiratory variability, suggesting right atrial pressure of 3 mmHg. Comparison(s): No prior  Echocardiogram. FINDINGS  Left Ventricle: Left ventricular ejection fraction, by estimation, is 60 to 65%. The left ventricle has normal function. The left ventricle has no regional wall motion abnormalities. The left ventricular internal cavity size was normal in size. There is  no left ventricular hypertrophy. Left ventricular diastolic parameters are consistent with Grade I diastolic dysfunction (impaired relaxation). Elevated left atrial pressure. Right Ventricle: The right ventricular size is normal. Right ventricular systolic function is normal. Tricuspid regurgitation signal is inadequate for assessing PA pressure. The tricuspid regurgitant velocity is 2.17 m/s, and with an assumed right atrial  pressure of 3 mmHg, the estimated right ventricular systolic pressure is 21.8 mmHg. Left Atrium: Left atrial size was normal in size. Right Atrium: Right atrial size was normal in size. Pericardium: There is no evidence of pericardial effusion. Mitral Valve: The mitral valve is normal in structure. Moderate mitral annular calcification. Trivial mitral valve regurgitation. No evidence of mitral valve stenosis. Tricuspid Valve: The tricuspid valve is normal in structure. Tricuspid valve regurgitation is trivial. No evidence of tricuspid stenosis. Aortic Valve: The aortic valve is tricuspid. Aortic valve regurgitation is not visualized. Aortic valve sclerosis/calcification is present, without any evidence of aortic stenosis. Pulmonic Valve: The pulmonic valve was not well visualized. Pulmonic valve regurgitation is not visualized. No evidence of pulmonic stenosis. Aorta: The aortic root is normal in size and structure. Venous: The inferior vena cava is normal in size with greater than 50% respiratory variability, suggesting right atrial pressure of 3 mmHg. IAS/Shunts: No atrial level shunt detected by color flow Doppler.  LEFT VENTRICLE PLAX 2D LVIDd:         4.40 cm     Diastology LVIDs:         2.20 cm     LV e'  medial:    5.03 cm/s LV PW:         1.30 cm     LV E/e' medial:  16.9 LV IVS:        1.00 cm     LV e' lateral:   10.10 cm/s LVOT diam:     2.10 cm     LV E/e' lateral: 8.4 LV SV:         79 LV SV Index:   44 LVOT Area:     3.46 cm  LV Volumes (MOD) LV vol d, MOD A2C: 54.6 ml  LV vol d, MOD A4C: 49.7 ml LV vol s, MOD A2C: 17.1 ml LV vol s, MOD A4C: 16.4 ml LV SV MOD A2C:     37.5 ml LV SV MOD A4C:     49.7 ml LV SV MOD BP:      36.9 ml RIGHT VENTRICLE             IVC RV S prime:     11.70 cm/s  IVC diam: 1.70 cm TAPSE (M-mode): 3.1 cm LEFT ATRIUM             Index        RIGHT ATRIUM           Index LA diam:        4.10 cm 2.27 cm/m   RA Area:     12.40 cm LA Vol (A2C):   30.1 ml 16.70 ml/m  RA Volume:   27.70 ml  15.37 ml/m LA Vol (A4C):   26.5 ml 14.70 ml/m LA Biplane Vol: 29.8 ml 16.53 ml/m  AORTIC VALVE LVOT Vmax:   123.00 cm/s LVOT Vmean:  72.600 cm/s LVOT VTI:    0.229 m  AORTA Ao Root diam: 2.90 cm Ao Asc diam:  3.40 cm MITRAL VALVE                TRICUSPID VALVE MV Area (PHT): 2.69 cm     TR Peak grad:   18.8 mmHg MV Decel Time: 282 msec     TR Vmax:        217.00 cm/s MV E velocity: 84.90 cm/s MV A velocity: 116.00 cm/s  SHUNTS MV E/A ratio:  0.73         Systemic VTI:  0.23 m                             Systemic Diam: 2.10 cm Olga Millers MD Electronically signed by Olga Millers MD Signature Date/Time: 01/26/2021/3:16:16 PM    Final    CT HEAD CODE STROKE WO CONTRAST  Addendum Date: 01/26/2021   ADDENDUM REPORT: 01/26/2021 01:22 ADDENDUM: Critical Value/emergent results were called by telephone at the time of interpretation on 01/26/2021 at 1:14 am to provider Caryl Pina, who verbally acknowledged these results. Electronically Signed   By: Deatra Robinson M.D.   On: 01/26/2021 01:22   Result Date: 01/26/2021 CLINICAL DATA:  Code stroke. EXAM: CT HEAD WITHOUT CONTRAST TECHNIQUE: Contiguous axial images were obtained from the base of the skull through the vertex without intravenous  contrast. COMPARISON:  None. FINDINGS: Brain: There is an acute intraparenchymal hematoma in the left basal ganglia that measures 2.5 x 1.5 x 2.1 cm (volume = 4.1 cm^3) There is periventricular hypoattenuation compatible with chronic microvascular disease. Vascular: No abnormal hyperdensity of the major intracranial arteries or dural venous sinuses. No intracranial atherosclerosis. Skull: The visualized skull base, calvarium and extracranial soft tissues are normal. Sinuses/Orbits: No fluid levels or advanced mucosal thickening of the visualized paranasal sinuses. No mastoid or middle ear effusion. The orbits are normal. IMPRESSION: 1. Acute intraparenchymal hematoma in the left basal ganglia, measuring 4.1 mL. No mass effect. 2. Chronic microvascular ischemia. Electronically Signed: By: Deatra Robinson M.D. On: 01/26/2021 01:07      HISTORY OF PRESENT ILLNESS Patient with a history of HLD, psoriasis and restless leg syndrome presented from home with right sided facial droop, slurred speech and right sided weakness.   HOSPITAL COURSE Patient was admitted to  the hospital and found to have an acute IPH in the left lentiform with estimated blood volume of 6 mL.  She was admitted to the ICU and initially required Cleviprex for blood pressure control, however, this was able to be stopped on 11/20.  She was started on 2.5 mg of Norvasc daily for blood pressure control.  Neurological: Acute hemorrhage in left lentiform, presumably secondary to hypertension, less likely hemorrhagic infarct Home ASA held, may resume after discharge when blood reabsorbed PT/OT recommend outpatient therapy   Cardiovascular: HTN Cleviprex discontinued, Norvasc 2.5 mg started for continuing blood pressure control.  SBP goal <160.  Recommend close follow up with PCP for blood pressure control  HLD LDL 182 with goal of <70.  Statin not initiated this admission due to IPH, will start on atorvastatin 80 mg daily after  discharge.  RN Pressure Injury Documentation:     DISCHARGE EXAM Blood pressure 136/64, pulse 75, temperature 98.1 F (36.7 C), temperature source Oral, resp. rate (!) 24, height 5\' 5"  (1.651 m), weight 72.9 kg, SpO2 99 %. General:  Patient is an alert, thin-appearing elderly Caucasian female in no acute distress.     NEURO:  Mental Status: AA&Ox3  Speech/Language: speech is without dysarthria or aphasia.  Naming, repetition, fluency, and comprehension intact.   Cranial Nerves:  II: PERRL. Visual fields full.  III, IV, VI: EOMI. Eyelids elevate symmetrically.  V: Sensation is intact to light touch and symmetrical to face.  VII: Right facial droop present.  Able to puff cheeks and raise eyebrows.  VIII: hearing intact to voice. IX, X:  Phonation is normal.  XII: tongue is midline without fasciculations. Motor: 5/5 strength to all muscle groups tested.  Tone: is normal and bulk is normal Sensation- Intact to light touch bilaterally. Extinction absent to light touch to DSS. Sharp/Dull   Vibration.   Coordination: FTN intact bilaterally, HKS: no ataxia in BLE.No drift.  Gait- deferred  Discharge Diet       Diet   Diet Heart Room service appropriate? Yes; Fluid consistency: Thin   liquids  DISCHARGE PLAN Disposition:  to home with outpatient PT/OT No antithrombotic for secondary stroke prevention secondary to IPH. Ongoing stroke risk factor control by Primary Care Physician at time of discharge Follow-up PCP , MD in 2 weeks. Follow-up in [facility] in 4 weeks, office to schedule an appointment.   32 minutes were spent preparing discharge.  Cortney E Ileana Ladd , MSN, AGACNP-BC [facility] See Amion for schedule and pager information 01/27/2021 5:41 PM I have personally obtained history,examined this patient, reviewed notes, independently viewed imaging studies, participated in medical decision making and plan of  care.ROS completed by me personally and pertinent positives fully documented  I have made any additions or clarifications directly to the above note. Agree with note above.    01/29/2021, MD Medical Director [facility] Pager: [phone] 01/28/2021 3:36 PM

## 2021-01-27 NOTE — Progress Notes (Addendum)
STROKE TEAM PROGRESS NOTE   SUBJECTIVE (INTERVAL HISTORY) Patient is seen in her room with her husband at the bedside.  She has been hemodynamically stable off Cleviprex and has had no acute events overnight.  Her neurological exam has improved today, and she is still awaiting PT evaluation.  Vital signs are stable.  Neurological exam showed only mild dysarthria and right facial asymmetry.  Blood pressure adequately controlled OBJECTIVE Vitals:   01/27/21 0900 01/27/21 1000 01/27/21 1200 01/27/21 1317  BP: 125/89 (!) 155/128  136/64  Pulse: 83 85  75  Resp: 13 (!) 28  17  Temp:   98.1 F (36.7 C)   TempSrc:   Oral   SpO2: 94% 95%  95%  Weight:      Height:        CBC:  Recent Labs  Lab 01/26/21 0049 01/26/21 0050  WBC  --  8.1  NEUTROABS  --  3.6  HGB 15.0 14.2  HCT 44.0 43.4  MCV  --  93.5  PLT  --  309     Basic Metabolic Panel:  Recent Labs  Lab 01/26/21 0049 01/26/21 0050  NA 139 137  K 3.6 4.0  CL 101 103  CO2  --  25  GLUCOSE 107* 108*  BUN 12 12  CREATININE 0.60 0.62  CALCIUM  --  9.6     Lipid Panel:  Recent Labs  Lab 01/26/21 1217  CHOL 258*  TRIG 68  HDL 62  CHOLHDL 4.2  VLDL 14  LDLCALC 010*   HgbA1c:  Lab Results  Component Value Date   HGBA1C 5.6 01/26/2021   Urine Drug Screen: No results found for: LABOPIA, COCAINSCRNUR, LABBENZ, AMPHETMU, THCU, LABBARB  Alcohol Level     Component Value Date/Time   ETH <10 01/26/2021 0050    IMAGING  Results for orders placed or performed during the hospital encounter of 01/26/21  MR BRAIN WO CONTRAST   Narrative   CLINICAL DATA:  86 year old female code stroke presentation with acute left lentiform hemorrhage.  EXAM: MRI HEAD WITHOUT CONTRAST  TECHNIQUE: Multiplanar, multiecho pulse sequences of the brain and surrounding structures were obtained without intravenous contrast.  COMPARISON:  Head CT 0052 hours today.  FINDINGS: Brain: Biconvex hemorrhage at the left lentiform  encompasses 29 by 17 x 25 mm (AP by transverse by CC) for an estimated blood volume of 6 mL. Blood products are T2 hyperintense, T1 isointense. Surrounding edema. Mild regional mass effect.  No extra-axial or intraventricular extension of blood identified. Multiple chronic microhemorrhages in the contralateral right deep gray matter nuclei.  Susceptibility artifact related to the blood products. No other abnormal diffusion. No other hemosiderin identified. No cortical encephalomalacia. But moderate widely scattered patchy and confluent bilateral cerebral white matter T2 and FLAIR hyperintensity. No midline shift, mass effect, evidence of mass lesion, ventriculomegaly. Cervicomedullary junction and pituitary are within normal limits.  Vascular: Major intracranial vascular flow voids are preserved, with mild generalized intracranial artery tortuosity.  Skull and upper cervical spine: Normal bone marrow signal. Negative visible cervical spine.  Sinuses/Orbits: Postoperative changes to both globes, otherwise negative orbits. Paranasal Visualized paranasal sinuses and mastoids are clear.  Other: Visible internal auditory structures appear normal. Negative visible scalp and face.  IMPRESSION: 1. Acute hemorrhage in the left lentiform with estimated blood volume of 6 mL. Mild regional edema and mass effect. No extra-axial or intraventricular extension of blood.  2. No other acute intracranial abnormality, but chronic microhemorrhages in the contralateral right deep  gray matter nuclei, and moderate cerebral white matter signal changes compatible with chronic small vessel disease.   Electronically Signed   By: Odessa Fleming M.D.   On: 01/26/2021 10:56        PHYSICAL EXAM  General:  Patient is an alert, thin-appearing elderly Caucasian female in no acute distress.   NEURO:  Mental Status: AA&Ox3  Speech/Language: speech is without dysarthria or aphasia.  Naming, repetition,  fluency, and comprehension intact.  Cranial Nerves:  II: PERRL. Visual fields full.  III, IV, VI: EOMI. Eyelids elevate symmetrically.  V: Sensation is intact to light touch and symmetrical to face.  VII: Right facial droop present.  Able to puff cheeks and raise eyebrows.  VIII: hearing intact to voice. IX, X:  Phonation is normal.  XII: tongue is midline without fasciculations. Motor: 5/5 strength to all muscle groups tested.  Tone: is normal and bulk is normal Sensation- Intact to light touch bilaterally. Extinction absent to light touch to DSS. Sharp/Dull   Vibration.   Coordination: FTN intact bilaterally, HKS: no ataxia in BLE.No drift.  Gait- deferred   ASSESSMENT/PLAN Ms. Elnor Renovato is a 86 y.o. female with history of of HLD, psoriasis, arthritis and RLS presenting from home with new onset of right facial droop, RUE weakness and slurred speech. She awoke at 12 midnight and was able to walk normally to the bathroom. After going to the bathroom her leg gave way and she had a controlled fall without striking her head.   LKW: 2130  Patient's symptoms have improved since yesterday, and she is awaiting PT evaluation before her discharge plan can be completed.  Stroke:  acute hemorrhage in the left lentiform, presumably secondary to hypertension.  Hemorrhagic infarct less likely  CT head- code stroke CT- acute intraparenchymal hematoma in the left basal ganglia measuring 4.69ml MRI head- 11/20: Acute hemorrhage in the left lentiform with estimated blood volume of 6 mL. Mild regional edema and mass effect. Chronic microhemorrhages in right deep gray matter nuclei and cerebral white matter signal changes consistent with small vessel disease 2D Echo  EF 60-65%, grade 1 diastolic dysfunction, no atrial level shunt LDL 182 HgbA1c 5.6 SCDs for VTE prophylaxis aspirin 81 mg daily prior to admission, now on No antithrombotic secondary to ICH Ongoing aggressive stroke risk factor  management Therapy recommendations:  Outpatient OT, PT pending Disposition:  Pending  Hypertension Stable SBP goal <160 Long-term BP goal normotensive Norvasc 2.5 mg daily Cleviprex infusion discontinued PRN hydralazine IVP q4 hours  Hyperlipidemia Home meds:  None LDL 182, goal < 70 Add atorvastatin 80mg  daily Continue statin at discharge   Other Stroke Risk Factors Advanced age Hx stroke/TIA Coronary artery disease  Other Active Problems Psoriasis Insomnia Arthritis GERD Restless Leg Syndrome  Hospital day # 1  Cortney E , MSN, AGACNP-BC [facility] See Amion for schedule and pager information 01/27/2021 1:38 PM  I have personally obtained history,examined this patient, reviewed notes, independently viewed imaging studies, participated in medical decision making and plan of care.ROS completed by me personally and pertinent positives fully documented  I have made any additions or clarifications directly to the above note. Agree with note above.  Mobilize out of bed.  Physical therapy consults.  Likely discharge home later today.  Start amlodipine low-dose 2.5 mg daily and low-salt diet.  Long discussion with patient and husband and daughter at the bedside and answered questions.  01/29/2021, MD Medical Director [facility] Pager: [phone]  01/27/2021 1:54 PM

## 2021-01-27 NOTE — Progress Notes (Signed)
Dr. Otelia Limes paged. Patient's blood pressure slightly below parameters of 130-150. Now more than 24 post stroke. Patient's neuro status has remained stable. Patient oriented, follows commands, moves all extremities, modified NIH stroke scale of 1. MD stated to relax blood pressure parameters to 120-150. If patient's blood pressure stays below 120, verbal order to give a 250 mL normal saline bolus. Will continue to monitor.

## 2021-01-27 NOTE — Progress Notes (Deleted)
Patient is doing much better and ready to progress.  Her precedex has been off all morning and restraints discontinued.  EEG is discontinued.  On Dys 3 diet and cortrak has been removed. She has been much more cooperative and pleasant.   Will transfer to floor unit once bed is available. Deborah Kim C 1:14 PM

## 2021-01-27 NOTE — TOC CAGE-AID Note (Signed)
Transition of Care [facility]) - CAGE-AID Screening   Patient Details  Name: Deborah Kim MRN: [id number] Date of Birth: 1934/06/12  Transition of Care [facility]) CM/SW Contact:    Ashari Llewellyn C Tarpley-Carter, LCSWA Phone Number: 01/27/2021, 8:52 AM   Clinical Narrative: Pt participated in Cage-Aid.  Pt stated she does not use substance or ETOH.  Pt was not offered resources, due to no usage of substance or ETOH.     Smt Lokey Tarpley-Carter, MSW, LCSW-A Pronouns:  She/Her/Hers [facility] Clinical Social Worker Direct Number:  [phone] [email or web address]  CAGE-AID Screening:    Have You Ever Felt You Ought to Cut Down on Your Drinking or Drug Use?: No Have People Annoyed You By [employer] Your Drinking Or Drug Use?: No Have You Felt Bad Or Guilty About Your Drinking Or Drug Use?: No Have You Ever Had a Drink or Used Drugs First Thing In The Morning to Steady Your Nerves or to Get Rid of a Hangover?: No CAGE-AID Score: 0  Substance Abuse Education Offered: No

## 2021-01-27 NOTE — Evaluation (Signed)
Physical Therapy Evaluation Patient Details Name: Deborah Kim MRN: [id number] DOB: [birth year] Today's Date: 01/27/2021  History of Present Illness  86 yo female presents to [facility] on 11/21 with R facial droop, slurred speech, RUE weakness. CT head reveals an acute left basal ganglia hemorrhage which appears most likely to be hypertensive and is approximately 4 cc in size, MRI shows L BG stable hemorrhage, questionable hemorrhagic infarct rather than parenchymal hemorrhage. PMH includes HLD, psoriasis, OA, RLS, L TKR 2013.  Clinical Impression   Pt presents with mild R-sided weakness UE>LE, impaired dynamic standing balance with DGI score of 16/24 indicating high fall risk, impaired activity tolerance. Pt to benefit from acute PT to address deficits. Pt ambulated hallway distance with no AD but close guard for safety, states she feels slightly less steady today vs baseline. PT recommending OPPT for higher level balance and R-sided strengthening, as well as supervision for all mobility at d/c home. Pt and family in agreement. PT to progress mobility as tolerated, and will continue to follow acutely.         Recommendations for follow up therapy are one component of a multi-disciplinary discharge planning process, led by the attending physician.  Recommendations may be updated based on patient status, additional functional criteria and insurance authorization.  Follow Up Recommendations Outpatient PT    Assistance Recommended at Discharge Intermittent Supervision/Assistance  Functional Status Assessment Patient has had a recent decline in their functional status and demonstrates the ability to make significant improvements in function in a reasonable and predictable amount of time.  Equipment Recommendations  None recommended by PT    Recommendations for Other Services       Precautions / Restrictions Precautions Precautions: Fall Restrictions Weight Bearing Restrictions: No       Mobility  Bed Mobility Overal bed mobility: Needs Assistance             General bed mobility comments: up in chair    Transfers Overall transfer level: Needs assistance Equipment used: None Transfers: Sit to/from Stand Sit to Stand: Min guard           General transfer comment: close guard for safety, multiple power up attempts before rising to standing.    Ambulation/Gait Ambulation/Gait assistance: Min guard Gait Distance (Feet): 300 Feet Assistive device: None Gait Pattern/deviations: Step-through pattern;Decreased stride length Gait velocity: decr     General Gait Details: close guard for safety, mildly unsteady initially but progressed with continued gait  Stairs Stairs: Yes Stairs assistance: Supervision Stair Management: One rail Left;Step to pattern;Forwards Number of Stairs: 2    Wheelchair Mobility    Modified Rankin (Stroke Patients Only) Modified Rankin (Stroke Patients Only) Pre-Morbid Rankin Score: No symptoms Modified Rankin: Moderately severe disability     Balance Overall balance assessment: Needs assistance;History of Falls   Sitting balance-Leahy Scale: Good       Standing balance-Leahy Scale: Fair                   Standardized Balance Assessment Standardized Balance Assessment : Dynamic Gait Index   Dynamic Gait Index Level Surface: Normal Change in Gait Speed: Mild Impairment Gait with Horizontal Head Turns: Mild Impairment Gait with Vertical Head Turns: Mild Impairment Gait and Pivot Turn: Mild Impairment Step Over Obstacle: Mild Impairment Step Around Obstacles: Mild Impairment Steps: Moderate Impairment Total Score: 16       Pertinent Vitals/Pain Pain Assessment: Faces Faces Pain Scale: No hurt Pain Intervention(s): Monitored during session    Home Living  Family/patient expects to be discharged to:: Private residence Living Arrangements: Spouse/significant other;Children Available Help at  Discharge: Available 24 hours/day Type of Home: House Home Access: Stairs to enter Entrance Stairs-Rails: Doctor, general practice of Steps: 2   Home Layout: One level Home Equipment: Shower seat - built in      Prior Function Prior Level of Function : Independent/Modified Independent               ADLs Comments: very active; drives     Hand Dominance   Dominant Hand: Left (ambidextriuus)    Extremity/Trunk Assessment   Upper Extremity Assessment Upper Extremity Assessment: Defer to OT evaluation    Lower Extremity Assessment Lower Extremity Assessment: RLE deficits/detail;LLE deficits/detail RLE Deficits / Details: hip flexion 3+/5, hip abd/add 4/5, knee extension 3+/5, knee flexion 4/5, DF WFL RLE Sensation: WNL LLE Deficits / Details: hip flexion 4/5, hip abd/add 4/5, knee extension 4/5, knee flexion 4/5, DF WFL LLE Sensation: WNL    Cervical / Trunk Assessment Cervical / Trunk Assessment: Normal  Communication   Communication: Expressive difficulties  Cognition Arousal/Alertness: Awake/alert Behavior During Therapy: WFL for tasks assessed/performed Overall Cognitive Status: Within Functional Limits for tasks assessed                                   Functional Status Assessment: Patient has not had a recent decline in their functional status      General Comments      Exercises Other Exercises Other Exercises: BE FAST acronym explained to pt and family   Assessment/Plan    PT Assessment Patient needs continued PT services  PT Problem List Decreased strength;Decreased mobility;Decreased activity tolerance;Decreased balance;Decreased knowledge of use of DME;Decreased safety awareness;Decreased knowledge of precautions       PT Treatment Interventions DME instruction;Therapeutic activities;Gait training;Therapeutic exercise;Patient/family education;Balance training;Stair training;Neuromuscular re-education;Functional mobility  training    PT Goals (Current goals can be found in the Care Plan section)  Acute Rehab PT Goals PT Goal Formulation: With patient Time For Goal Achievement: 02/03/21 Potential to Achieve Goals: Good    Frequency Min 4X/week   Barriers to discharge        Co-evaluation               AM-PAC PT "6 Clicks" Mobility  Outcome Measure Help needed turning from your back to your side while in a flat bed without using bedrails?: A Little Help needed moving from lying on your back to sitting on the side of a flat bed without using bedrails?: A Little Help needed moving to and from a bed to a chair (including a wheelchair)?: A Little Help needed standing up from a chair using your arms (e.g., wheelchair or bedside chair)?: A Little Help needed to walk in hospital room?: A Little Help needed climbing 3-5 steps with a railing? : A Little 6 Click Score: 18    End of Session Equipment Utilized During Treatment: Gait belt Activity Tolerance: Patient tolerated treatment well Patient left: in chair;with chair alarm set;with call bell/phone within reach;with family/visitor present Nurse Communication: Mobility status PT Visit Diagnosis: Other abnormalities of gait and mobility (R26.89);Muscle weakness (generalized) (M62.81)    Time: 1345-1410 PT Time Calculation (min) (ACUTE ONLY): 25 min   Charges:   PT Evaluation $PT Eval Low Complexity: 1 Low          Kylia Grajales S, PT DPT Acute Rehabilitation Services Pager [phone]  Office  [phone]   Charnel Giles E Stroup 01/27/2021, 2:42 PM

## 2021-01-27 NOTE — Evaluation (Signed)
Speech Language Pathology Evaluation Patient Details Name: Deborah Kim MRN: [id number] DOB: 08-28-1934 Today's Date: 01/27/2021 Time: 7425-9563 SLP Time Calculation (min) (ACUTE ONLY): 16 min  Problem List:  Patient Active Problem List   Diagnosis Date Noted   ICH (intracerebral hemorrhage) (HCC) 01/26/2021   Postop Sinus tachycardia 11/26/2011   Postop Transfusion 11/26/2011   Postop Hyponatremia 11/24/2011   Postop Acute blood loss anemia 11/24/2011   OA (osteoarthritis) of knee 11/23/2011   Past Medical History:  Past Medical History:  Diagnosis Date   Arthritis    GERD (gastroesophageal reflux disease)    Hyperlipidemia    Psoriasis    Restless leg syndrome    Past Surgical History:  Past Surgical History:  Procedure Laterality Date   ABDOMINAL HYSTERECTOMY     APPENDECTOMY     TOTAL KNEE ARTHROPLASTY  11/23/2011   Procedure: TOTAL KNEE ARTHROPLASTY;  Surgeon: Loanne Drilling, MD;  Location: [facility] ORS;  Service: Orthopedics;  Laterality: Left;   HPI:  86 yo female presents to [facility] on 11/21 with R facial droop, slurred speech, RUE weakness. CT head reveals an acute left basal ganglia hemorrhage which appears most likely to be hypertensive and is approximately 4 cc in size, MRI shows L BG stable hemorrhage, questionable hemorrhagic infarct rather than parenchymal hemorrhage. PMH includes HLD, psoriasis, OA, RLS, L TKR 2013.   Assessment / Plan / Recommendation Clinical Impression  Pt was seen for a speech-language evaluation. Overall, pt experiences mild memory retrieval deficits, however reports these are baseline, and mild motor speech deficit resulting in speech intelligibility ~90% at the conversation level. Oral mechanism exam revealed mild right sided weakness, reduced ROM, and asymmetry of face and tongue which pt reports has improved since admission. Pt oriented x4; SLP administered portions of SLUMS examination revealed short term recall 1/5 improving to 5/5 given  category cues. On clock drawing task, pt demonstrated intact organization, self-monitoring, and self-correcting skills however required repetition of task x2 to complete all components. Pt reports using a calendar at home to remember important events. SLP educated pt on speech intelligibility strategies to slow rate and overarticulate; pt verbalized understanding. Pt currently at prior level of function and requires no further acute SLP services.    SLP Assessment  SLP Recommendation/Assessment: Patient does not need any further Speech Lanaguage Pathology Services SLP Visit Diagnosis: Cognitive communication deficit (R41.841);Dysarthria and anarthria (R47.1)    Recommendations for follow up therapy are one component of a multi-disciplinary discharge planning process, led by the attending physician.  Recommendations may be updated based on patient status, additional functional criteria and insurance authorization.    Follow Up Recommendations  No SLP follow up    Assistance Recommended at Discharge  PRN  Functional Status Assessment Patient has not had a recent decline in their functional status  Frequency and Duration           SLP Evaluation Cognition  Overall Cognitive Status: History of cognitive impairments - at baseline Arousal/Alertness: Awake/alert Orientation Level: Oriented X4 Attention: Sustained Sustained Attention: Appears intact Memory: Impaired Memory Impairment: Retrieval deficit;Decreased short term memory Decreased Short Term Memory: Verbal basic;Functional basic Awareness: Appears intact Problem Solving: Appears intact Executive Function: Self Monitoring;Self Correcting;Organizing Organizing: Appears intact Self Monitoring: Appears intact Self Correcting: Appears intact Safety/Judgment: Appears intact       Comprehension  Auditory Comprehension Overall Auditory Comprehension: Appears within functional limits for tasks assessed Visual  Recognition/Discrimination Discrimination: Within Function Limits Reading Comprehension Reading Status: Not tested    Expression  Expression Primary Mode of Expression: Verbal Verbal Expression Overall Verbal Expression: Appears within functional limits for tasks assessed Written Expression Dominant Hand: Left Written Expression: Not tested   Oral / Motor  Oral Motor/Sensory Function Overall Oral Motor/Sensory Function: Mild impairment Facial ROM: Reduced right Facial Symmetry: Abnormal symmetry right Facial Strength: Reduced right Facial Sensation: Reduced right Lingual ROM: Reduced right Lingual Symmetry: Abnormal symmetry right Lingual Strength: Reduced Velum: Within Functional Limits Mandible: Within Functional Limits Motor Speech Overall Motor Speech: Impaired Respiration: Within functional limits Phonation: Normal Resonance: Within functional limits Articulation: Impaired Level of Impairment: Word Intelligibility: Intelligibility reduced Word: 75-100% accurate Phrase: 75-100% accurate Sentence: 75-100% accurate Conversation: 75-100% accurate Motor Planning: Witnin functional limits Motor Speech Errors: Aware Effective Techniques: Slow rate;Over-articulate   GO           Jeannie Done, SLP-Student         Jeannie Done 01/27/2021, 12:10 PM

## 2021-02-07 DIAGNOSIS — Z8679 Personal history of other diseases of the circulatory system: Secondary | ICD-10-CM | POA: Diagnosis not present

## 2021-02-07 DIAGNOSIS — J301 Allergic rhinitis due to pollen: Secondary | ICD-10-CM | POA: Diagnosis not present

## 2021-02-07 DIAGNOSIS — G479 Sleep disorder, unspecified: Secondary | ICD-10-CM | POA: Diagnosis not present

## 2021-02-07 DIAGNOSIS — Z5181 Encounter for therapeutic drug level monitoring: Secondary | ICD-10-CM | POA: Diagnosis not present

## 2021-02-07 DIAGNOSIS — R739 Hyperglycemia, unspecified: Secondary | ICD-10-CM | POA: Diagnosis not present

## 2021-02-07 DIAGNOSIS — R600 Localized edema: Secondary | ICD-10-CM | POA: Diagnosis not present

## 2021-02-07 DIAGNOSIS — K219 Gastro-esophageal reflux disease without esophagitis: Secondary | ICD-10-CM | POA: Diagnosis not present

## 2021-02-07 DIAGNOSIS — R471 Dysarthria and anarthria: Secondary | ICD-10-CM | POA: Diagnosis not present

## 2021-02-07 DIAGNOSIS — E559 Vitamin D deficiency, unspecified: Secondary | ICD-10-CM | POA: Diagnosis not present

## 2021-03-17 DIAGNOSIS — R300 Dysuria: Secondary | ICD-10-CM | POA: Diagnosis not present

## 2021-03-17 DIAGNOSIS — R350 Frequency of micturition: Secondary | ICD-10-CM | POA: Diagnosis not present

## 2021-03-17 DIAGNOSIS — N390 Urinary tract infection, site not specified: Secondary | ICD-10-CM | POA: Diagnosis not present

## 2021-04-03 ENCOUNTER — Ambulatory Visit: Payer: Medicare HMO | Admitting: Neurology

## 2021-04-03 ENCOUNTER — Encounter: Payer: Self-pay | Admitting: Neurology

## 2021-04-03 ENCOUNTER — Other Ambulatory Visit: Payer: Self-pay

## 2021-04-03 VITALS — BP 132/80 | HR 81 | Ht 65.0 in | Wt 158.1 lb

## 2021-04-03 DIAGNOSIS — R471 Dysarthria and anarthria: Secondary | ICD-10-CM

## 2021-04-03 DIAGNOSIS — I610 Nontraumatic intracerebral hemorrhage in hemisphere, subcortical: Secondary | ICD-10-CM | POA: Diagnosis not present

## 2021-04-03 DIAGNOSIS — R29810 Facial weakness: Secondary | ICD-10-CM | POA: Diagnosis not present

## 2021-04-03 NOTE — Patient Instructions (Signed)
I had a long d/w patient and her daughter about her recent hemorrhagic stroke, risk for recurrent stroke/TIAs, personally independently reviewed imaging studies and stroke evaluation results and answered questions.Continue aspirin 81 mg daily  for secondary stroke prevention and maintain strict control of hypertension with blood pressure goal below 130/90, diabetes with hemoglobin A1c goal below 6.5% and lipids with LDL cholesterol goal below 70 mg/dL. I also advised the patient to eat a healthy diet with plenty of whole grains, cereals, fruits and vegetables, exercise regularly and maintain ideal body weight Followup in the future with me in 6 months or call earlier if necessary. Stroke Prevention Some medical conditions and behaviors can lead to a higher chance of having a stroke. You can help prevent a stroke by eating healthy, exercising, not smoking, and managing any medical conditions you have. Stroke is a leading cause of functional impairment. Primary prevention is particularly important because a majority of strokes are first-time events. Stroke changes the lives of not only those who experience a stroke but also their family and other caregivers. How can this condition affect me? A stroke is a medical emergency and should be treated right away. A stroke can lead to brain damage and can sometimes be life-threatening. If a person gets medical treatment right away, there is a better chance of surviving and recovering from a stroke. What can increase my risk? The following medical conditions may increase your risk of a stroke: Cardiovascular disease. High blood pressure (hypertension). Diabetes. High cholesterol. Sickle cell disease. Blood clotting disorders (hypercoagulable state). Obesity. Sleep disorders (obstructive sleep apnea). Other risk factors include: Being older than age 66. Having a history of blood clots, stroke, or mini-stroke (transient ischemic attack, TIA). Genetic factors,  such as race, ethnicity, or a family history of stroke. Smoking cigarettes or using other tobacco products. Taking birth control pills, especially if you also use tobacco. Heavy use of alcohol or drugs, especially cocaine and methamphetamine. Physical inactivity. What actions can I take to prevent this? Manage your health conditions High cholesterol levels. Eating a healthy diet is important for preventing high cholesterol. If cholesterol cannot be managed through diet alone, you may need to take medicines. Take any prescribed medicines to control your cholesterol as told by your health care provider. Hypertension. To reduce your risk of stroke, try to keep your blood pressure below 130/80. Eating a healthy diet and exercising regularly are important for controlling blood pressure. If these steps are not enough to manage your blood pressure, you may need to take medicines. Take any prescribed medicines to control hypertension as told by your health care provider. Ask your health care provider if you should monitor your blood pressure at home. Have your blood pressure checked every year, even if your blood pressure is normal. Blood pressure increases with age and some medical conditions. Diabetes. Eating a healthy diet and exercising regularly are important parts of managing your blood sugar (glucose). If your blood sugar cannot be managed through diet and exercise, you may need to take medicines. Take any prescribed medicines to control your diabetes as told by your health care provider. Get evaluated for obstructive sleep apnea. Talk to your health care provider about getting a sleep evaluation if you snore a lot or have excessive sleepiness. Make sure that any other medical conditions you have, such as atrial fibrillation or atherosclerosis, are managed. Nutrition Follow instructions from your health care provider about what to eat or drink to help manage your health condition. These  instructions may include: Reducing your daily calorie intake. Limiting how much salt (sodium) you use to 1,500 milligrams (mg) each day. Using only healthy fats for cooking, such as olive oil, canola oil, or sunflower oil. Eating healthy foods. You can do this by: Choosing foods that are high in fiber, such as whole grains, and fresh fruits and vegetables. Eating at least 5 servings of fruits and vegetables a day. Try to fill one-half of your plate with fruits and vegetables at each meal. Choosing lean protein foods, such as lean cuts of meat, poultry without skin, fish, tofu, beans, and nuts. Eating low-fat dairy products. Avoiding foods that are high in sodium. This can help lower blood pressure. Avoiding foods that have saturated fat, trans fat, and cholesterol. This can help prevent high cholesterol. Avoiding processed and prepared foods. Counting your daily carbohydrate intake.  Lifestyle If you drink alcohol: Limit how much you have to: 0-1 drink a day for women who are not pregnant. 0-2 drinks a day for men. Know how much alcohol is in your drink. In the [place] one drink equals one 12 oz bottle of beer ( ), one 5 oz glass of wine ( ), or one 1 oz glass of hard liquor (38mL). Do not use any products that contain nicotine or tobacco. These products include cigarettes, chewing tobacco, and vaping devices, such as e-cigarettes. If you need help quitting, ask your health care provider. Avoid secondhand smoke. Do not use drugs. Activity  Try to stay at a healthy weight. Get at least 30 minutes of exercise on most days, such as: Fast walking. Biking. Swimming. Medicines Take over-the-counter and prescription medicines only as told by your health care provider. Aspirin or blood thinners (antiplatelets or anticoagulants) may be recommended to reduce your risk of forming blood clots that can lead to stroke. Avoid taking birth control pills. Talk to your health care provider about  the risks of taking birth control pills if: You are over 1 years old. You smoke. You get very bad headaches. You have had a blood clot. Where to find more information American Stroke Association: www.strokeassociation.org Get help right away if: You or a loved one has any symptoms of a stroke. "BE FAST" is an easy way to remember the main warning signs of a stroke: B - Balance. Signs are dizziness, sudden trouble walking, or loss of balance. E - Eyes. Signs are trouble seeing or a sudden change in vision. F - Face. Signs are sudden weakness or numbness of the face, or the face or eyelid drooping on one side. A - Arms. Signs are weakness or numbness in an arm. This happens suddenly and usually on one side of the body. S - Speech. Signs are sudden trouble speaking, slurred speech, or trouble understanding what people say. T - Time. Time to call emergency services. Write down what time symptoms started. You or a loved one has other signs of a stroke, such as: A sudden, severe headache with no known cause. Nausea or vomiting. Seizure. These symptoms may represent a serious problem that is an emergency. Do not wait to see if the symptoms will go away. Get medical help right away. Call your local emergency services (911 in the [place] Do not drive yourself to the hospital. Summary You can help to prevent a stroke by eating healthy, exercising, not smoking, limiting alcohol intake, and managing any medical conditions you may have. Do not use any products that contain nicotine or tobacco. These include cigarettes, chewing tobacco, and  vaping devices, such as e-cigarettes. If you need help quitting, ask your health care provider. Remember "BE FAST" for warning signs of a stroke. Get help right away if you or a loved one has any of these signs. This information is not intended to replace advice given to you by your health care provider. Make sure you discuss any questions you have with your health care  provider. Document Revised: 09/25/2019 Document Reviewed: 09/25/2019 Elsevier Patient Education  2022 [employer]

## 2021-04-03 NOTE — Progress Notes (Signed)
[facility] [address] [place]. [place] [address] [phone]       OFFICE FOLLOW-UP NOTE  Ms. Autumnrose Stembridge Date of Birth:  08/31/1934 Medical Record Number:  [id number]   HPI: Ms. Duguid is a 87 year old pleasant Caucasian lady seen today for initial office follow-up visit following hospital admission for intracerebral hemorrhage in November 2022.  History is obtained from the patient and her daughters accompanying her as well as review of electronic medical records and opossum reviewed pertinent available imaging films in PACS.  She has past medical history of hyperlipidemia, psoriasis, arthritis and restless leg syndrome.  She presented with sudden onset of right facial droop, slurred speech and right upper extremity weakness on 01/26/2021.  She was last known normal the night before going to bed.  She woke up at around midnight and was able to walk normally to the bathroom.  Next day when she woke up in the morning she noticed her symptoms and came to the ER.  CT scan of the head showed small 4 cubic cc left basal ganglia hemorrhage with minimum cytotoxic edema and no intraventricular hemorrhage or hydrocephalus.  MRI scan showed stable appearance of the basal ganglia hemorrhage with mild cytotoxic edema and changes of chronic small vessel disease.  Echocardiogram showed ejection fraction of 60 to 65% without cardiac source of embolism.  LDL cholesterol elevated at 182 mg percent and total cholesterol at 258.  Hemoglobin A1c was normal.  Basic chemistries and coagulation labs were normal.  Blood pressure was only mildly elevated.  She is observed in the intensive care unit and started on Norvasc for blood pressure control and did well.  She was seen by physical Occupational Therapy and discharged home with outpatient therapies.  Patient states she is done well.  Slurred speech has improved.  She still has minimum facial droop but right hand weakness has recovered to baseline.  She  has no significant deficits.  She does feel tired more otherwise she is back to baseline.  Her blood pressure states is under good control she takes Norvasc and it usually runs in the [id number] systolic range.  She has no new complaints.  She denies any prior longstanding history of uncontrolled hypertension.  She is also been started on Lipitor which she is tolerating well without muscle aches and pains.  ROS:   14 system review of systems is positive for dysarthria, slurred speech, facial weakness all other systems negative  PMH:  Past Medical History:  Diagnosis Date   Arthritis    GERD (gastroesophageal reflux disease)    Hyperlipidemia    Psoriasis    Restless leg syndrome     Social History:  Social History   Socioeconomic History   Marital status: Married    Spouse name: Not on file   Number of children: Not on file   Years of education: Not on file   Highest education level: Not on file  Occupational History   Not on file  Tobacco Use   Smoking status: Former    Types: Cigarettes    Quit date: 11/15/1965    Years since quitting: 55.4   Smokeless tobacco: Not on file  Substance and Sexual Activity   Alcohol use: Yes    Comment: rare   Drug use: Not on file   Sexual activity: Not on file  Other Topics Concern   Not on file  Social History Narrative   Not on file   Social Determinants of Health   Financial  Resource Strain: Not on file  Food Insecurity: Not on file  Transportation Needs: Not on file  Physical Activity: Not on file  Stress: Not on file  Social Connections: Not on file  Intimate Partner Violence: Not on file    Medications:   Current Outpatient Medications on File Prior to Visit  Medication Sig Dispense Refill   amLODipine (NORVASC) 2.5 MG tablet Take 1 tablet (2.5 mg total) by mouth daily. 30 tablet 0   atorvastatin (LIPITOR) 80 MG tablet Take 1 tablet (80 mg total) by mouth daily. 30 tablet 0   calcipotriene (DOVONOX) 0.005 % cream Apply 1  application topically 2 (two) times daily as needed (psoriasis).     pantoprazole (PROTONIX) 40 MG tablet Take 1 tablet (40 mg total) by mouth at bedtime. 30 tablet 0   VITAMIN D PO Take 1 tablet by mouth daily.     zolpidem (AMBIEN) 10 MG tablet Take 10 mg by mouth at bedtime as needed for sleep.     No current facility-administered medications on file prior to visit.    Allergies:  No Known Allergies  Physical Exam General: well developed, well nourished pleasant elderly Caucasian lady, seated, in no evident distress Head: head normocephalic and atraumatic.  Neck: supple with no carotid or supraclavicular bruits Cardiovascular: regular rate and rhythm, no murmurs Musculoskeletal: no deformity Skin:  no rash/petichiae Vascular:  Normal pulses all extremities Vitals:   04/03/21 0829  BP: 132/80  Pulse: 81  SpO2: 94%   Neurologic Exam Mental Status: Awake and fully alert. Oriented to place and time. Recent and remote memory intact. Attention span, concentration and fund of knowledge appropriate. Mood and affect appropriate.  Cranial Nerves: Fundoscopic exam reveals sharp disc margins. Pupils equal, briskly reactive to light. Extraocular movements full without nystagmus. Visual fields full to confrontation. Hearing intact. Facial sensation intact.  Mild right lower facial weakness., tongue, palate moves normally and symmetrically.  Motor: Normal bulk and tone. Normal strength in all tested extremity muscles. Sensory.: intact to touch ,pinprick .position and vibratory sensation.  Coordination: Rapid alternating movements normal in all extremities. Finger-to-nose and heel-to-shin performed accurately bilaterally. Gait and Station: Arises from chair without difficulty. Stance is normal. Gait demonstrates normal stride length and balance . Able to heel, toe and tandem walk with moderate difficulty.  Reflexes: 1+ and symmetric. Toes downgoing.   NIHSS  1 Modified Rankin  2   ASSESSMENT:  87 year old Caucasian lady with small left basal ganglia hypertensive intracerebral hemorrhage who is doing extremely well with only minimal residual right facial weakness.     PLAN:I had a long d/w patient and her daughter about her recent hemorrhagic stroke, risk for recurrent stroke/TIAs, personally independently reviewed imaging studies and stroke evaluation results and answered questions.Continue aspirin 81 mg daily  for secondary stroke prevention and maintain strict control of hypertension with blood pressure goal below 130/90, diabetes with hemoglobin A1c goal below 6.5% and lipids with LDL cholesterol goal below 70 mg/dL. I also advised the patient to eat a healthy diet with plenty of whole grains, cereals, fruits and vegetables, exercise regularly and maintain ideal body weight Followup in the future with me in 6 months or call earlier if necessary.Greater than 50% of time during this 35 minute visit was spent on counseling,explanation of diagnosis, planning of further management, discussion with patient and family and coordination of care Antony Contras, MD Note: This document was prepared with digital dictation and possible smart phrase technology. Any transcriptional errors that result from  this process are unintentional

## 2021-05-08 DIAGNOSIS — Z5181 Encounter for therapeutic drug level monitoring: Secondary | ICD-10-CM | POA: Diagnosis not present

## 2021-05-08 DIAGNOSIS — J301 Allergic rhinitis due to pollen: Secondary | ICD-10-CM | POA: Diagnosis not present

## 2021-05-08 DIAGNOSIS — R471 Dysarthria and anarthria: Secondary | ICD-10-CM | POA: Diagnosis not present

## 2021-05-08 DIAGNOSIS — R0981 Nasal congestion: Secondary | ICD-10-CM | POA: Diagnosis not present

## 2021-05-08 DIAGNOSIS — Z8679 Personal history of other diseases of the circulatory system: Secondary | ICD-10-CM | POA: Diagnosis not present

## 2021-05-08 DIAGNOSIS — R600 Localized edema: Secondary | ICD-10-CM | POA: Diagnosis not present

## 2021-05-08 DIAGNOSIS — E559 Vitamin D deficiency, unspecified: Secondary | ICD-10-CM | POA: Diagnosis not present

## 2021-05-08 DIAGNOSIS — R739 Hyperglycemia, unspecified: Secondary | ICD-10-CM | POA: Diagnosis not present

## 2021-05-08 DIAGNOSIS — K219 Gastro-esophageal reflux disease without esophagitis: Secondary | ICD-10-CM | POA: Diagnosis not present

## 2021-05-08 DIAGNOSIS — T466X5A Adverse effect of antihyperlipidemic and antiarteriosclerotic drugs, initial encounter: Secondary | ICD-10-CM | POA: Diagnosis not present

## 2021-05-08 DIAGNOSIS — M791 Myalgia, unspecified site: Secondary | ICD-10-CM | POA: Diagnosis not present

## 2021-05-08 DIAGNOSIS — G479 Sleep disorder, unspecified: Secondary | ICD-10-CM | POA: Diagnosis not present

## 2021-08-12 DIAGNOSIS — T466X5A Adverse effect of antihyperlipidemic and antiarteriosclerotic drugs, initial encounter: Secondary | ICD-10-CM | POA: Diagnosis not present

## 2021-08-12 DIAGNOSIS — R739 Hyperglycemia, unspecified: Secondary | ICD-10-CM | POA: Diagnosis not present

## 2021-08-12 DIAGNOSIS — M791 Myalgia, unspecified site: Secondary | ICD-10-CM | POA: Diagnosis not present

## 2021-08-12 DIAGNOSIS — E7800 Pure hypercholesterolemia, unspecified: Secondary | ICD-10-CM | POA: Diagnosis not present

## 2021-08-12 DIAGNOSIS — Z8679 Personal history of other diseases of the circulatory system: Secondary | ICD-10-CM | POA: Diagnosis not present

## 2021-08-12 DIAGNOSIS — E559 Vitamin D deficiency, unspecified: Secondary | ICD-10-CM | POA: Diagnosis not present

## 2021-08-21 DIAGNOSIS — R5381 Other malaise: Secondary | ICD-10-CM | POA: Diagnosis not present

## 2021-08-21 DIAGNOSIS — R7303 Prediabetes: Secondary | ICD-10-CM | POA: Diagnosis not present

## 2021-08-21 DIAGNOSIS — G479 Sleep disorder, unspecified: Secondary | ICD-10-CM | POA: Diagnosis not present

## 2021-08-21 DIAGNOSIS — R0981 Nasal congestion: Secondary | ICD-10-CM | POA: Diagnosis not present

## 2021-08-21 DIAGNOSIS — J309 Allergic rhinitis, unspecified: Secondary | ICD-10-CM | POA: Diagnosis not present

## 2021-10-01 ENCOUNTER — Telehealth: Payer: Self-pay | Admitting: *Deleted

## 2021-10-01 ENCOUNTER — Encounter: Payer: Self-pay | Admitting: Neurology

## 2021-10-01 ENCOUNTER — Ambulatory Visit: Payer: Medicare HMO | Admitting: Neurology

## 2021-10-01 VITALS — BP 160/85 | HR 69 | Ht 64.6 in | Wt 153.4 lb

## 2021-10-01 DIAGNOSIS — E782 Mixed hyperlipidemia: Secondary | ICD-10-CM

## 2021-10-01 DIAGNOSIS — Z8673 Personal history of transient ischemic attack (TIA), and cerebral infarction without residual deficits: Secondary | ICD-10-CM

## 2021-10-01 DIAGNOSIS — Z789 Other specified health status: Secondary | ICD-10-CM | POA: Diagnosis not present

## 2021-10-01 NOTE — Telephone Encounter (Signed)
Start form for Leqvio faxed to company, with demographics and insurance card. Ph: [phone] Fax: [phone]  Treating Site: [facility] Contact: Kim Chambers Ph: [phone] Fax: [phone]  The company will complete a benefit investigation and contact the patient with the outcome. She will be sent to [facility] for treatment.  Once the benefit statement form has been received at our office, it will need to be faxed over the [facility], atten: Kim Chambers. She will contact the patient to schedule the appt.    

## 2021-10-01 NOTE — Patient Instructions (Signed)
I had a long discussion with the patient regarding her remote intracerebral hemorrhage and need for aggressive blood pressure control and to keep it below 140/90 and lipid control to keep LDL below 70.  She unfortunately has not been able to tolerate Lipitor as well as Crestor due to side effects and has discontinued it.  I recommend she try alternatives like PCSK9 inhibitor injections or the new medication Leqvio.  Patient states she will not be able to inject herself and would prefer once in 6 monthly injection instead.  We will do paperwork to see if insurance will cover Leqvio injections.  She will return for follow-up in the future in a year or call earlier if necessary.

## 2021-10-01 NOTE — Progress Notes (Unsigned)
[facility] [address] [place]. [place] [address] [phone]       OFFICE FOLLOW-UP NOTE  [name] Date of Birth:  May 19, 1934 Medical Record Number:  [id number]   HPI: Initial visit 04/03/2021 Deborah Kim is a 87 year old pleasant Caucasian lady seen today for initial office follow-up visit following hospital admission for intracerebral hemorrhage in November 2022.  History is obtained from the patient and her daughters accompanying her as well as review of electronic medical records and opossum reviewed pertinent available imaging films in PACS.  She has past medical history of hyperlipidemia, psoriasis, arthritis and restless leg syndrome.  She presented with sudden onset of right facial droop, slurred speech and right upper extremity weakness on 01/26/2021.  She was last known normal the night before going to bed.  She woke up at around midnight and was able to walk normally to the bathroom.  Next day when she woke up in the morning she noticed her symptoms and came to the ER.  CT scan of the head showed small 4 cubic cc left basal ganglia hemorrhage with minimum cytotoxic edema and no intraventricular hemorrhage or hydrocephalus.  MRI scan showed stable appearance of the basal ganglia hemorrhage with mild cytotoxic edema and changes of chronic small vessel disease.  Echocardiogram showed ejection fraction of 60 to 65% without cardiac source of embolism.  LDL cholesterol elevated at 182 mg percent and total cholesterol at 258.  Hemoglobin A1c was normal.  Basic chemistries and coagulation labs were normal.  Blood pressure was only mildly elevated.  She is observed in the intensive care unit and started on Norvasc for blood pressure control and did well.  She was seen by physical Occupational Therapy and discharged home with outpatient therapies.  Patient states she is done well.  Slurred speech has improved.  She still has minimum facial droop but right hand weakness has  recovered to baseline.  She has no significant deficits.  She does feel tired more otherwise she is back to baseline.  Her blood pressure states is under good control she takes Norvasc and it usually runs in the [id number] systolic range.  She has no new complaints.  She denies any prior longstanding history of uncontrolled hypertension.  She is also been started on Lipitor which she is tolerating well without muscle aches and pains. Update 10/01/2021 : She returns for follow-up after last visit 6 months ago.  Patient states she is doing well.  She has had no recurrent stroke or TIA symptoms.  She remains on aspirin 81 mg daily which is tolerating well without bruising or bleeding.  She states her blood pressure at home is quite well controlled and usually in the [id number] systolic range but today it is elevated in our office at 160/85 and she thinks this may be because of anxiety.  Patient was at Lipitor at last visit but discontinued it due to hip pain.  She was switched to Crestor but she did not tolerate this either and started feeling tired with low energy and hence stopped it a month ago.  She is reluctant to try Repatha or Praluent as they are self injection and will be interested in trying Leqvio you which can be injected every 6 monthly.  She has no new complaints today ROS:   14 system review of systems is positive for myalgia, hip pain, tiredness facial weakness all other systems negative  PMH:  Past Medical History:  Diagnosis Date   Arthritis  GERD (gastroesophageal reflux disease)    Hyperlipidemia    Psoriasis    Restless leg syndrome     Social History:  Social History   Socioeconomic History   Marital status: Married    Spouse name: Not on file   Number of children: Not on file   Years of education: Not on file   Highest education level: Not on file  Occupational History   Not on file  Tobacco Use   Smoking status: Former    Types: Cigarettes    Quit date: 11/15/1965    Years since  quitting: 55.9   Smokeless tobacco: Not on file  Substance and Sexual Activity   Alcohol use: Yes    Comment: rare   Drug use: Not on file   Sexual activity: Not on file  Other Topics Concern   Not on file  Social History Narrative   Not on file   Social Determinants of Health   Financial Resource Strain: Not on file  Food Insecurity: Not on file  Transportation Needs: Not on file  Physical Activity: Not on file  Stress: Not on file  Social Connections: Not on file  Intimate Partner Violence: Not on file    Medications:   Current Outpatient Medications on File Prior to Visit  Medication Sig Dispense Refill   amLODipine (NORVASC) 2.5 MG tablet Take 1 tablet (2.5 mg total) by mouth daily. 30 tablet 0   aspirin EC 81 MG tablet Take 81 mg by mouth daily. Swallow whole.     calcipotriene (DOVONOX) 0.005 % cream Apply 1 application topically 2 (two) times daily as needed (psoriasis).     loratadine (CLARITIN) 10 MG tablet Take 10 mg by mouth daily.     VITAMIN D PO Take 1 tablet by mouth daily.     zolpidem (AMBIEN) 10 MG tablet Take 10 mg by mouth at bedtime as needed for sleep.     atorvastatin (LIPITOR) 80 MG tablet Take 80 mg by mouth daily. Discontinued due to side effects for 1 month. (Patient not taking: Reported on 10/01/2021)     No current facility-administered medications on file prior to visit.    Allergies:  No Known Allergies  Physical Exam General: well developed, well nourished pleasant elderly Caucasian lady, seated, in no evident distress Head: head normocephalic and atraumatic.  Neck: supple with no carotid or supraclavicular bruits Cardiovascular: regular rate and rhythm, no murmurs Musculoskeletal: no deformity Skin:  no rash/petichiae Vascular:  Normal pulses all extremities Vitals:   10/01/21 1257  BP: (!) 160/85  Pulse: 69   Neurologic Exam Mental Status: Awake and fully alert. Oriented to place and time. Recent and remote memory intact. Attention  span, concentration and fund of knowledge appropriate. Mood and affect appropriate.  Cranial Nerves: Fundoscopic exam not done. Pupils equal, briskly reactive to light. Extraocular movements full without nystagmus. Visual fields full to confrontation. Hearing intact. Facial sensation intact.  Mild right lower facial weakness., tongue, palate moves normally and symmetrically.  Motor: Normal bulk and tone. Normal strength in all tested extremity muscles. Sensory.: intact to touch ,pinprick .position and vibratory sensation.  Coordination: Rapid alternating movements normal in all extremities. Finger-to-nose and heel-to-shin performed accurately bilaterally. Gait and Station: Arises from chair without difficulty. Stance is normal. Gait demonstrates normal stride length and balance . Able to heel, toe and tandem walk with moderate difficulty.  Reflexes: 1+ and symmetric. Toes downgoing.   NIHSS  1 Modified Rankin  2   ASSESSMENT: 87 year old  Caucasian lady with small left basal ganglia hypertensive intracerebral hemorrhage who is doing extremely well with only minimal residual right facial weakness.     PLAN:I had a long discussion with the patient regarding her remote intracerebral hemorrhage and need for aggressive blood pressure control and to keep it below 140/90 and lipid control to keep LDL below 70.  She unfortunately has not been able to tolerate Lipitor as well as Crestor due to side effects and has discontinued it.  I recommend she try alternatives like PCSK9 inhibitor injections or the new medication Leqvio.  Patient states she will not be able to inject herself and would prefer once in 6 monthly injection instead.  We will do paperwork to see if insurance will cover Leqvio injections.  She will return for follow-up in the future in a year or call earlier if necessary.Greater than 50% of time during this 35 minute visit was spent on counseling,explanation of diagnosis, planning of further  management, discussion with patient and family and coordination of care Delia Heady, MD Note: This document was prepared with digital dictation and possible smart phrase technology. Any transcriptional errors that result from this process are unintentional

## 2021-10-01 NOTE — Telephone Encounter (Signed)
The patient called our office and left message with the phone room that she is hesitant to start Leqvio. She is going to think about it and let our office know. She is aware that we will move forward with getting the injection approved. If she decides to start the medication, then there will not be any delay.

## 2021-10-02 NOTE — Telephone Encounter (Signed)
I have updated the Dx codes and refaxed to # [phone], confirmation received.

## 2021-10-02 NOTE — Telephone Encounter (Signed)
Deborah Kim is asking for a call back [phone] re: the enrollment form that is missing info: the diagnosis code, they only have E78. They are also missing the treating site and NPI

## 2021-10-07 NOTE — Telephone Encounter (Signed)
Marlou Porch called our office to verify our practice NPI. This  information was provided to her.  She will be the dedicated rep on this account. She can be reached at [phone], ext [phone].

## 2021-10-08 DIAGNOSIS — G479 Sleep disorder, unspecified: Secondary | ICD-10-CM | POA: Diagnosis not present

## 2021-10-08 DIAGNOSIS — J3489 Other specified disorders of nose and nasal sinuses: Secondary | ICD-10-CM | POA: Diagnosis not present

## 2021-10-08 DIAGNOSIS — E7800 Pure hypercholesterolemia, unspecified: Secondary | ICD-10-CM | POA: Diagnosis not present

## 2021-10-08 DIAGNOSIS — K219 Gastro-esophageal reflux disease without esophagitis: Secondary | ICD-10-CM | POA: Diagnosis not present

## 2021-10-09 ENCOUNTER — Other Ambulatory Visit: Payer: Self-pay | Admitting: *Deleted

## 2021-10-09 NOTE — Addendum Note (Signed)
Addended by: Lindell Spar C on: 10/09/2021 11:02 AM   Modules accepted: Orders

## 2021-10-09 NOTE — Telephone Encounter (Signed)
Received a benefits investigation summary from [employer]. The medication will need a prior authorization. If approved, the patient still has a $5900 out-of-pocket deductible. She has only met $105 this year. The co-pay would be quite high. The do have a patient assistance program she could apply for to see if she qualifies.   I called the patient to review this information. States she does not wish to start Leqvio at this time. She feels uncomfortable being on such a new medication. She does not wish to proceed.  Reports she went to see her PCP, Dr. Harrington Challenger. He has prescribed Zetia 24m, one tablet daily. She wants to see how this medication works first.

## 2021-10-09 NOTE — Telephone Encounter (Signed)
Legvio program notified of this update.

## 2021-12-01 DIAGNOSIS — G479 Sleep disorder, unspecified: Secondary | ICD-10-CM | POA: Diagnosis not present

## 2021-12-01 DIAGNOSIS — R7303 Prediabetes: Secondary | ICD-10-CM | POA: Diagnosis not present

## 2021-12-01 DIAGNOSIS — E559 Vitamin D deficiency, unspecified: Secondary | ICD-10-CM | POA: Diagnosis not present

## 2021-12-01 DIAGNOSIS — I10 Essential (primary) hypertension: Secondary | ICD-10-CM | POA: Diagnosis not present

## 2021-12-01 DIAGNOSIS — Z0000 Encounter for general adult medical examination without abnormal findings: Secondary | ICD-10-CM | POA: Diagnosis not present

## 2021-12-01 DIAGNOSIS — R059 Cough, unspecified: Secondary | ICD-10-CM | POA: Diagnosis not present

## 2021-12-01 DIAGNOSIS — Z6826 Body mass index (BMI) 26.0-26.9, adult: Secondary | ICD-10-CM | POA: Diagnosis not present

## 2021-12-01 DIAGNOSIS — E7800 Pure hypercholesterolemia, unspecified: Secondary | ICD-10-CM | POA: Diagnosis not present

## 2022-01-19 DIAGNOSIS — H524 Presbyopia: Secondary | ICD-10-CM | POA: Diagnosis not present

## 2022-04-13 ENCOUNTER — Other Ambulatory Visit: Payer: Self-pay | Admitting: Neurology

## 2022-10-01 ENCOUNTER — Ambulatory Visit: Payer: Medicare HMO | Admitting: Neurology

## 2023-04-19 IMAGING — MR MR HEAD W/O CM
12 of 13 series · 44 of 48 positions shown · non-contrast
Comparison: Head CT 3357 hours today.

CLINICAL DATA: 86-year-old female code stroke presentation with
acute left lentiform hemorrhage.

EXAM:
MRI HEAD WITHOUT CONTRAST
TECHNIQUE: Multiplanar, multiecho pulse sequences of the brain and surrounding
structures were obtained without intravenous contrast.

[Series 5: DWI · axial · 3.0mm · 0.88mm/px · z∈[-50,+83]mm · 8 of 94 slices shown (1 of 4)]
[im 1/94]
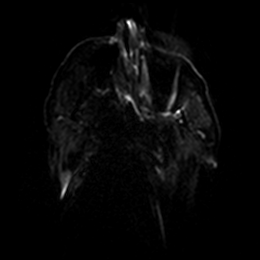
[im 14/94]
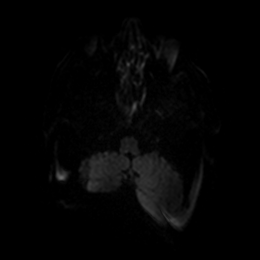
[im 27/94]
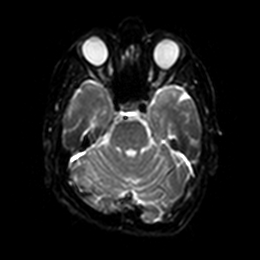
[im 40/94]
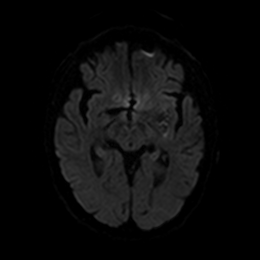
[im 54/94]
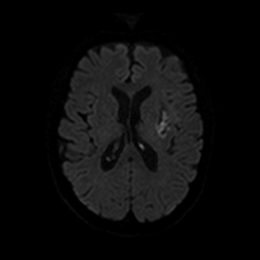
[im 67/94]
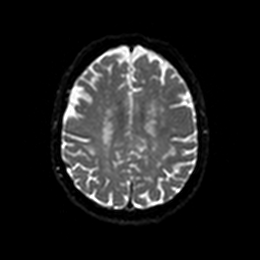
[im 80/94]
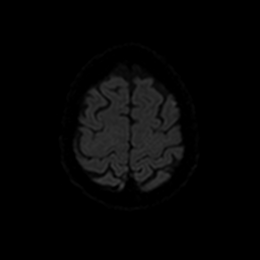
[im 94/94]
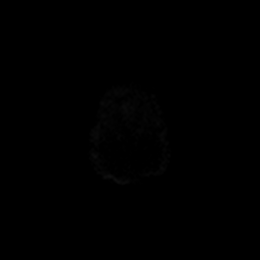

[Series 6: DWI · axial · 3.0mm · 0.88mm/px · z∈[-50,+83]mm · 4 of 48 slices shown (2 of 4)]
[im 1/48]
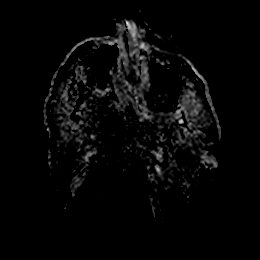
[im 16/48]
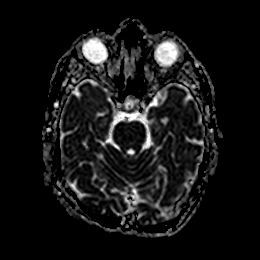
[im 32/48]
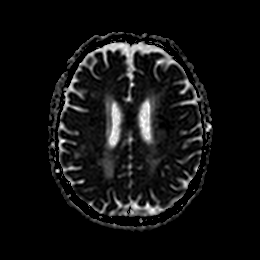
[im 48/48]
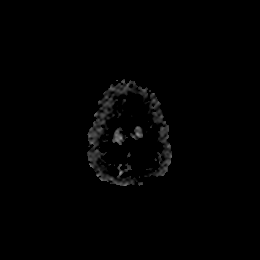

[Series 7: DWI · coronal · 4.0mm · 0.88mm/px · 6 of 68 slices shown (3 of 4)]
[im 1/68]
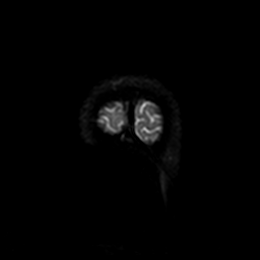
[im 14/68]
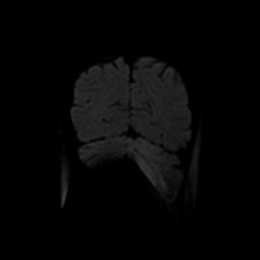
[im 27/68]
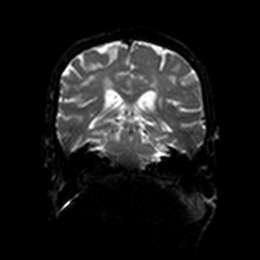
[im 41/68]
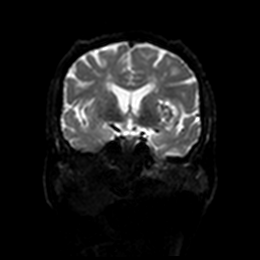
[im 54/68]
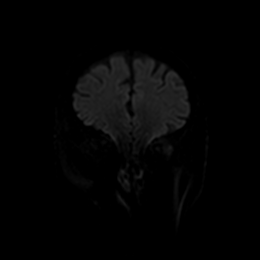
[im 68/68]
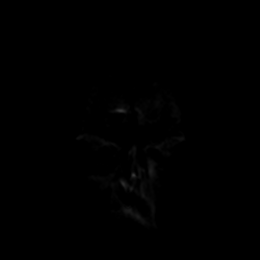

[Series 8: DWI · coronal · 4.0mm · 0.88mm/px · 3 of 34 slices shown (4 of 4)]
[im 1/34]
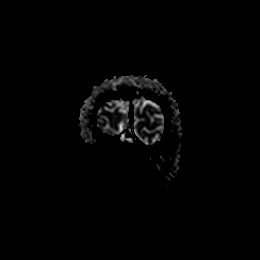
[im 17/34]
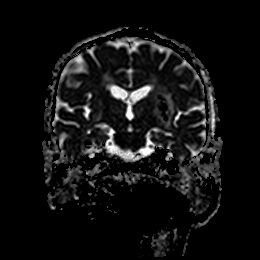
[im 34/34]
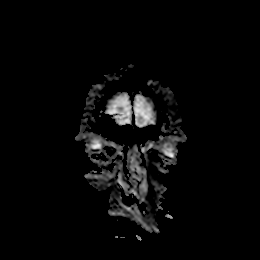

[Series 9: T1 · sagittal · 5.0mm · 0.75mm/px · 2 of 22 slices shown]
[im 1/22]
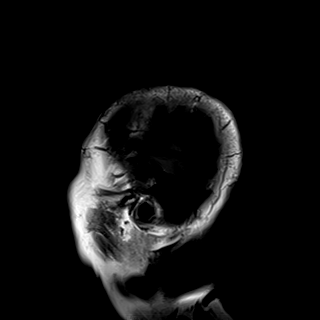
[im 22/22]
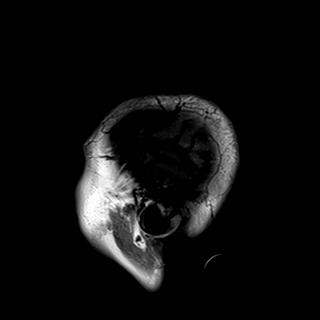

[Series 10: T2 · axial · 5.0mm · 0.72mm/px · z∈[-49,+82]mm · 2 of 24 slices shown (1 of 2)]
[im 1/24]
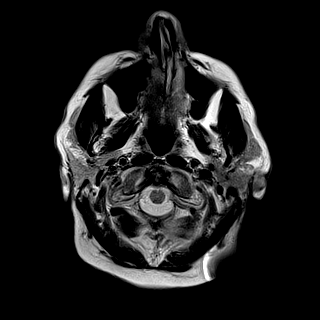
[im 24/24]
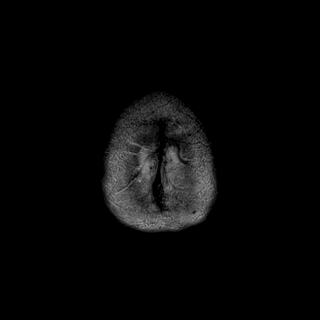

[Series 11: FLAIR · axial · 5.0mm · 0.45mm/px · z∈[-48,+82]mm · 2 of 24 slices shown]
[im 1/24]
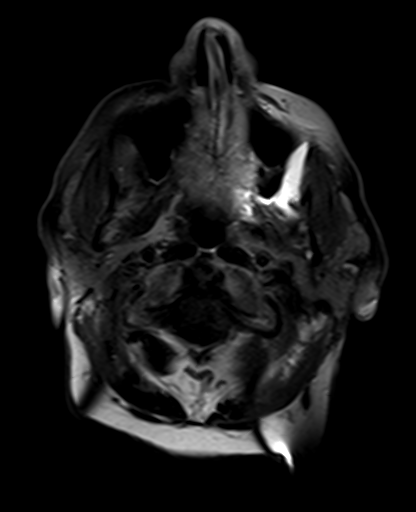
[im 24/24]
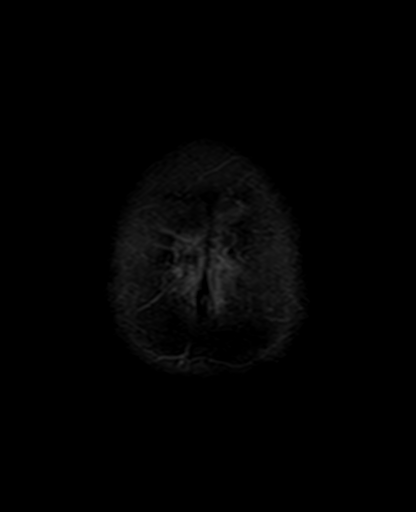

[Series 12: mag_images · axial · 3.0mm · 0.90mm/px · z∈[-49,+84]mm · 4 of 48 slices shown]
[im 1/48]
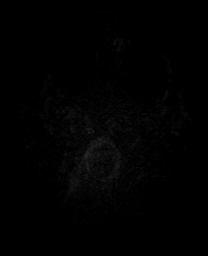
[im 16/48]
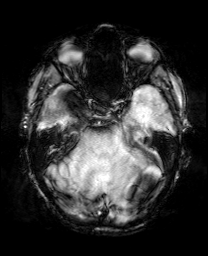
[im 32/48]
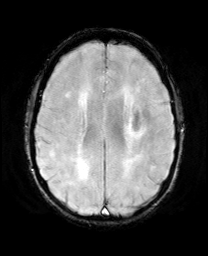
[im 48/48]
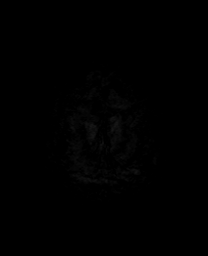

[Series 13: pha_images · axial · 3.0mm · 0.90mm/px · z∈[-49,+84]mm · 4 of 48 slices shown]
[im 1/48]
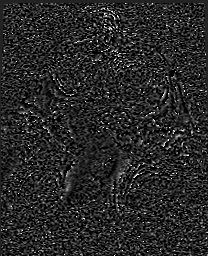
[im 16/48]
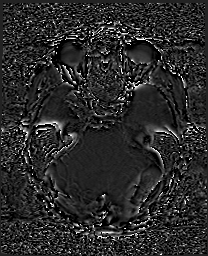
[im 32/48]
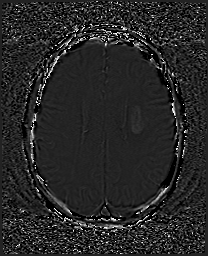
[im 48/48]
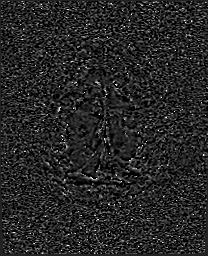

[Series 14: swi_images · axial · 3.0mm · 0.90mm/px · z∈[-49,+84]mm · 4 of 48 slices shown]
[im 1/48]
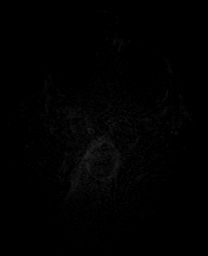
[im 16/48]
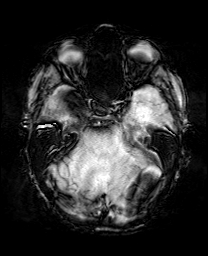
[im 32/48]
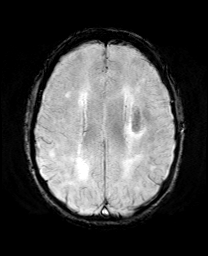
[im 48/48]
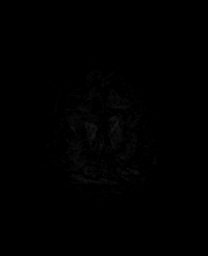

[Series 15: mip_images(sw) · axial · 24.0mm · 0.90mm/px · z∈[-40,+74]mm · 3 of 41 slices shown]
[im 1/41]
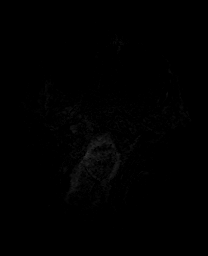
[im 21/41]
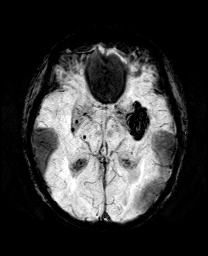
[im 41/41]
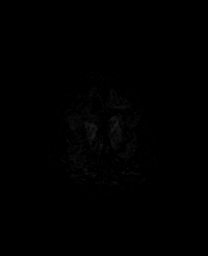

[Series 17: T2 · coronal · 5.0mm · 0.34mm/px · 2 of 29 slices shown (2 of 2)]
[im 1/29]
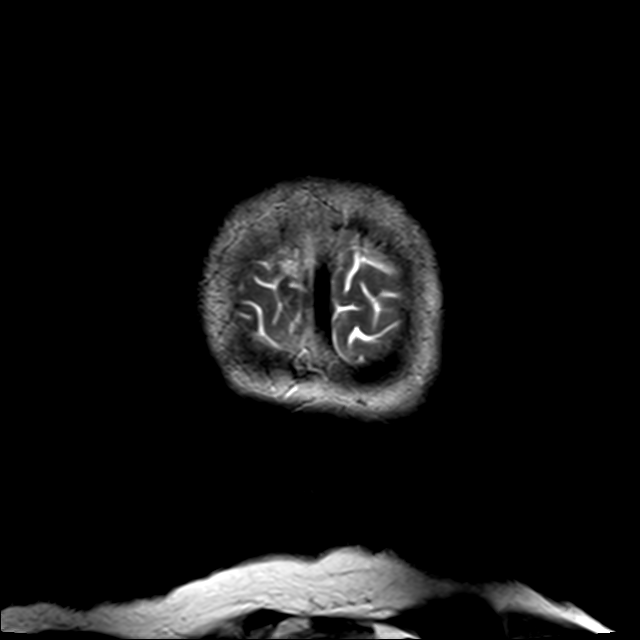
[im 29/29]
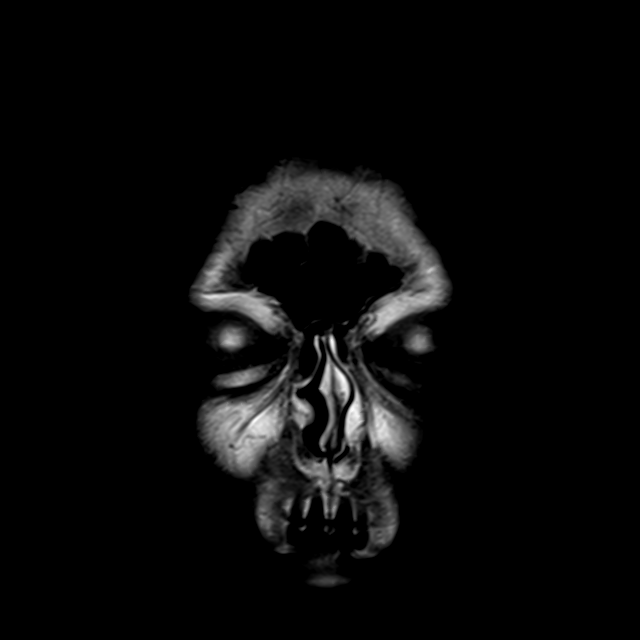

[44 of 48 positions shown; findings below may reference images not displayed]

FINDINGS: Brain: Biconvex hemorrhage at the left lentiform encompasses 29 by
17 x 25 mm (AP by transverse by CC) for an estimated blood volume of
6 mL. Blood products are T2 hyperintense, T1 isointense. Surrounding
edema. Mild regional mass effect.

No extra-axial or intraventricular extension of blood identified.
Multiple chronic microhemorrhages in the contralateral right deep
gray matter nuclei.

Susceptibility artifact related to the blood products. No other
abnormal diffusion. No other hemosiderin identified. No cortical
encephalomalacia. But moderate widely scattered patchy and confluent
bilateral cerebral white matter T2 and FLAIR hyperintensity. No
midline shift, mass effect, evidence of mass lesion,
ventriculomegaly. Cervicomedullary junction and pituitary are within
normal limits.

Vascular: Major intracranial vascular flow voids are preserved, with
mild generalized intracranial artery tortuosity.

Skull and upper cervical spine: Normal bone marrow signal. Negative
visible cervical spine.

Sinuses/Orbits: Postoperative changes to both globes, otherwise
negative orbits. Paranasal Visualized paranasal sinuses and mastoids
are clear.

Other: Visible internal auditory structures appear normal. Negative
visible scalp and face.
IMPRESSION: 1. Acute hemorrhage in the left lentiform with estimated blood
volume of 6 mL. Mild regional edema and mass effect. No extra-axial
or intraventricular extension of blood.

2. No other acute intracranial abnormality, but chronic
microhemorrhages in the contralateral right deep gray matter nuclei,
and moderate cerebral white matter signal changes compatible with
chronic small vessel disease.

## 2023-10-17 ENCOUNTER — Emergency Department (HOSPITAL_COMMUNITY)

## 2023-10-17 ENCOUNTER — Encounter (HOSPITAL_COMMUNITY): Payer: Self-pay | Admitting: *Deleted

## 2023-10-17 ENCOUNTER — Encounter: Payer: Self-pay | Admitting: Neurology

## 2023-10-17 ENCOUNTER — Other Ambulatory Visit: Payer: Self-pay

## 2023-10-17 ENCOUNTER — Emergency Department (HOSPITAL_COMMUNITY)
Admission: EM | Admit: 2023-10-17 | Discharge: 2023-10-17 | Disposition: A | Discharge status: Home / Self Care | Attending: Emergency Medicine | Admitting: Emergency Medicine

## 2023-10-17 DIAGNOSIS — Z7982 Long term (current) use of aspirin: Secondary | ICD-10-CM | POA: Insufficient documentation

## 2023-10-17 DIAGNOSIS — I2699 Other pulmonary embolism without acute cor pulmonale: Secondary | ICD-10-CM | POA: Insufficient documentation

## 2023-10-17 DIAGNOSIS — Z7901 Long term (current) use of anticoagulants: Secondary | ICD-10-CM | POA: Insufficient documentation

## 2023-10-17 DIAGNOSIS — U071 COVID-19: Secondary | ICD-10-CM | POA: Diagnosis not present

## 2023-10-17 DIAGNOSIS — R079 Chest pain, unspecified: Secondary | ICD-10-CM

## 2023-10-17 LAB — COMPREHENSIVE METABOLIC PANEL WITH GFR
ALT: 13 U/L (ref 0–44)
AST: 18 U/L (ref 15–41)
Albumin: 3.5 g/dL (ref 3.5–5.0)
Alkaline Phosphatase: 71 U/L (ref 38–126)
Anion gap: 11 (ref 5–15)
BUN: 8 mg/dL (ref 8–23)
CO2: 21 mmol/L — ABNORMAL LOW (ref 22–32)
Calcium: 8.9 mg/dL (ref 8.9–10.3)
Chloride: 104 mmol/L (ref 98–111)
Creatinine, Ser: 0.69 mg/dL (ref 0.44–1.00)
GFR, Estimated: >60 mL/min (ref >60)
Glucose, Bld: 118 mg/dL — ABNORMAL HIGH (ref 70–99)
Potassium: 3.7 mmol/L (ref 3.5–5.1)
Sodium: 136 mmol/L (ref 135–145)
Total Bilirubin: 0.3 mg/dL (ref 0.0–1.2)
Total Protein: 6 g/dL — ABNORMAL LOW (ref 6.5–8.1)

## 2023-10-17 LAB — CBC WITH DIFFERENTIAL/PLATELET
Abs Immature Granulocytes: 0.01 K/uL (ref 0.00–0.07)
Basophils Absolute: 0 K/uL (ref 0.0–0.1)
Basophils Relative: 1 %
Eosinophils Absolute: 0.1 K/uL (ref 0.0–0.5)
Eosinophils Relative: 1 %
HCT: 42.6 % (ref 36.0–46.0)
Hemoglobin: 13.8 g/dL (ref 12.0–15.0)
Immature Granulocytes: 0 %
Lymphocytes Relative: 14 %
Lymphs Abs: 0.7 K/uL (ref 0.7–4.0)
MCH: 30 pg (ref 26.0–34.0)
MCHC: 32.4 g/dL (ref 30.0–36.0)
MCV: 92.6 fL (ref 80.0–100.0)
Monocytes Absolute: 0.4 K/uL (ref 0.1–1.0)
Monocytes Relative: 8 %
Neutro Abs: 3.8 K/uL (ref 1.7–7.7)
Neutrophils Relative %: 76 %
Platelets: 209 K/uL (ref 150–400)
RBC: 4.6 MIL/uL (ref 3.87–5.11)
RDW: 12.9 % (ref 11.5–15.5)
WBC: 5.1 K/uL (ref 4.0–10.5)
nRBC: 0 % (ref 0.0–0.2)

## 2023-10-17 LAB — MAGNESIUM: Magnesium: 2 mg/dL (ref 1.7–2.4)

## 2023-10-17 LAB — RESP PANEL BY RT-PCR (RSV, FLU A&B, COVID)  RVPGX2
Influenza A by PCR: NEGATIVE
Influenza B by PCR: NEGATIVE
Resp Syncytial Virus by PCR: NEGATIVE
SARS Coronavirus 2 by RT PCR: POSITIVE — AB

## 2023-10-17 LAB — TROPONIN I (HIGH SENSITIVITY)
Troponin I (High Sensitivity): 4 ng/L (ref <18)
Troponin I (High Sensitivity): 4 ng/L (ref <18)

## 2023-10-17 LAB — D-DIMER, QUANTITATIVE: D-Dimer, Quant: 1.29 ug{FEU}/mL — ABNORMAL HIGH (ref 0.00–0.50)

## 2023-10-17 MED ORDER — APIXABAN (ELIQUIS) VTE STARTER PACK (10MG AND 5MG): ORAL_TABLET | ORAL | 0 refills | Status: AC

## 2023-10-17 MED ORDER — SODIUM CHLORIDE 0.9 % IV BOLUS
500.0000 mL | Freq: Once | INTRAVENOUS | Status: AC
  Administered 2023-10-17: 500 mL via INTRAVENOUS

## 2023-10-17 MED ORDER — APIXABAN 5 MG PO TABS
10.0000 mg | ORAL_TABLET | Freq: Two times a day (BID) | ORAL | Status: DC
  Administered 2023-10-17: 10 mg via ORAL
  Filled 2023-10-17: qty 2

## 2023-10-17 MED ORDER — IOHEXOL 350 MG/ML SOLN
60.0000 mL | Freq: Once | INTRAVENOUS | Status: AC | PRN
  Administered 2023-10-17: 60 mL via INTRAVENOUS

## 2023-10-17 NOTE — ED Provider Notes (Signed)
 [place] [facility] AT [facility] Provider Note   CSN: [id number] Arrival date & time: 10/17/23  9260     Patient presents with: Chest Pain   Deborah Kim is a 89 y.o. female.  With a history of GERD, hyperlipidemia, osteoarthritis and intracerebral hemorrhage who presents to the ED via EMS for chest pain.  Patient awoke from sleep around 0500 this morning.  While she was still at rest she experienced acute onset chest pain localized to both substernal region.  Pain was made worse with movement and deep inspiration.  EMS gave 324 mg aspirin prior to arrival.  Chest pain has resolved since coming to the ED.  Denies shortness of breath fevers chills coughing nausea vomiting diaphoresis recent illness.  No similar episodes of chest pain which she can recall.  Takes daily aspirin.  No anticoagulation.    Chest Pain      Prior to Admission medications   Medication Sig Start Date End Date Taking? Authorizing Provider  amLODipine  (NORVASC ) 2.5 MG tablet Take 1 tablet (2.5 mg total) by mouth daily. Patient taking differently: Take 2.5 mg by mouth at bedtime. 01/27/21  Yes de Clint Kill, Cortney E, NP  APIXABAN  (ELIQUIS ) VTE STARTER PACK (10MG  AND 5MG ) Take as directed on package: start with two-5mg  tablets twice daily for 7 days. On day 8, switch to one-5mg  tablet twice daily. 10/17/23  Yes Pamella Ozell LABOR, DO  aspirin EC 81 MG tablet Take 81 mg by mouth daily. Swallow whole.   Yes [provider]  calcipotriene  (DOVONOX) 0.005 % cream Apply 1 application topically 2 (two) times daily as needed (psoriasis).   Yes [provider]  fexofenadine (ALLEGRA) 60 MG tablet Take 60 mg by mouth daily.   Yes [provider]  magnesium (MAGTAB) 84 MG ( ) TBCR SR tablet Take 250 mg by mouth daily.   Yes [provider]  pravastatin (PRAVACHOL) 20 MG tablet Take 20 mg by mouth 3 (three) times a week. 09/26/23  Yes [provider]  zolpidem  (AMBIEN) 10 MG tablet Take 10 mg by mouth at bedtime as needed for sleep.   Yes [provider]  ezetimibe (ZETIA) 10 MG tablet Take 10 mg by mouth daily. Patient not taking: Reported on 10/17/2023    [provider]  loratadine (CLARITIN) 10 MG tablet Take 10 mg by mouth daily. Patient not taking: Reported on 10/17/2023    [provider]  VITAMIN D PO Take 1 tablet by mouth daily. Patient not taking: Reported on 10/17/2023    [provider]    Allergies: Patient has no known allergies.    Review of Systems  Cardiovascular:  Positive for chest pain.    Updated Vital Signs BP 139/69   Pulse 78   Temp 98.5 F (36.9 C) (Oral)   Resp 19   Wt 69.4 kg   SpO2 99%   BMI 25.78 kg/m   Physical Exam Vitals and nursing note reviewed.  HENT:     Head: Normocephalic and atraumatic.  Eyes:     Pupils: Pupils are equal, round, and reactive to light.  Cardiovascular:     Rate and Rhythm: Normal rate and regular rhythm.  Pulmonary:     Effort: Pulmonary effort is normal.     Breath sounds: Normal breath sounds.  Abdominal:     Palpations: Abdomen is soft.     Tenderness: There is no abdominal tenderness.  Musculoskeletal:     Right lower leg:  No edema.     Left lower leg: No edema.  Skin:    General: Skin is warm and dry.  Neurological:     Mental Status: She is alert.  Psychiatric:        Mood and Affect: Mood normal.     (all labs ordered are listed, but only abnormal results are displayed) Labs Reviewed  RESP PANEL BY RT-PCR (RSV, FLU A&B, COVID)  [id number] - Abnormal; Notable for the following components:      Result Value   SARS Coronavirus 2 by RT PCR POSITIVE (*)    All other components within normal limits  COMPREHENSIVE METABOLIC PANEL WITH GFR - Abnormal; Notable for the following components:   CO2 21 (*)    Glucose, Bld 118 (*)    Total Protein 6.0 (*)    All other components within normal limits  D-DIMER, QUANTITATIVE -  Abnormal; Notable for the following components:   D-Dimer, Quant 1.29 (*)    All other components within normal limits  CBC WITH DIFFERENTIAL/PLATELET  MAGNESIUM  TROPONIN I (HIGH SENSITIVITY)  TROPONIN I (HIGH SENSITIVITY)    EKG: EKG Interpretation Date/Time:  Sunday October 17 2023 07:48:44 EDT Ventricular Rate:  86 PR Interval:  151 QRS Duration:  114 QT Interval:  391 QTC Calculation: 468 R Axis:   -36  Text Interpretation: Sinus rhythm Incomplete right bundle branch block Inferior infarct, old Consider anterior infarct Confirmed by Pamella Sharper [phone]) on 10/17/2023 10:38:54 AM  Radiology: CT Angio Chest PE W and/or Wo Contrast Result Date: 10/17/2023 CLINICAL DATA:  Positive D-dimer. EXAM: CT ANGIOGRAPHY CHEST WITH CONTRAST TECHNIQUE: Multidetector CT imaging of the chest was performed using the standard protocol during bolus administration of intravenous contrast. Multiplanar CT image reconstructions and MIPs were obtained to evaluate the vascular anatomy. RADIATION DOSE REDUCTION: This exam was performed according to the departmental dose-optimization program which includes automated exposure control, adjustment of the mA and/or kV according to patient size and/or use of iterative reconstruction technique. CONTRAST:  60mL OMNIPAQUE  IOHEXOL  350 MG/ML SOLN COMPARISON:  None Available. FINDINGS: Cardiovascular: Single subsegmental anterior segment right upper lobe pulmonary embolus (7/117). Atherosclerotic calcification of the aorta, aortic valve and coronary arteries. Heart is enlarged. Left ventricle appears somewhat hypertrophied. No pericardial effusion. Mediastinum/Nodes: No pathologically enlarged mediastinal, hilar or axillary lymph nodes. Air in the esophagus can be seen with dysmotility. Lungs/Pleura: Image quality is degraded by slight expiratory phase imaging. Dependent atelectasis bilaterally. No pleural fluid. Airway is unremarkable. Upper Abdomen: Scattered small hepatic  cysts. No specific follow-up necessary. Visualized portions of the liver, adrenal glands, kidneys, spleen, pancreas, stomach and bowel are otherwise grossly unremarkable. No upper abdominal adenopathy. Musculoskeletal: Degenerative changes in the spine. Review of the MIP images confirms the above findings. IMPRESSION: 1. Single subsegmental right upper lobe pulmonary embolus, of questionable clinical significance. 2. Aortic atherosclerosis (ICD10-I70.0). Coronary artery calcification. Electronically Signed   By: Newell Eke M.D.   On: 10/17/2023 10:20   DG Chest Portable 1 View Result Date: 10/17/2023 CLINICAL DATA:  Chest pain EXAM: PORTABLE CHEST 1 VIEW COMPARISON:  11/25/2011 FINDINGS: Numerous leads and wires project over the chest. Midline trachea. Mild cardiomegaly. Atherosclerosis in the transverse aorta. No pleural effusion or pneumothorax. Hyperinflation. IMPRESSION: 1. Hyperinflation, without acute disease. 2. Aortic Atherosclerosis (ICD10-I70.0). 3. Mild cardiomegaly. Electronically Signed   By: Rockey Kilts M.D.   On: 10/17/2023 09:49     Procedures   Medications Ordered in the ED  apixaban  (ELIQUIS ) tablet 10 mg (  has no administration in time range)  sodium chloride  0.9 % bolus 500 mL (0 mLs Intravenous Stopped 10/17/23 1053)  iohexol  (OMNIPAQUE ) 350 MG/ML injection 60 mL (60 mLs Intravenous Contrast Given 10/17/23 1013)    Clinical Course as of 10/17/23 1115  Sun Oct 17, 2023  1102 Initial D-dimer elevated.  CTA shows subsegmental PE.  No tachycardia, tachypnea or hypoxemia.  Patient does have history of intracranial hemorrhage in 2022 that was suspected to be due to hypertension at that time.  Blood pressure seems to be better controlled overall.  Renal function within normal limits today.  No other contraindications to starting Eliquis  after discussing with pharmacy.  Will provide her first dose here and call in 10-month supply starter pack.  Education provided by ED pharmacist  Oriet.  Patient appropriate for discharge with close PCP follow-up [MP]  1115 Patient tested positive for COVID today. [MP]    Clinical Course User Index [MP] Pamella Ozell LABOR, DO                                 Medical Decision Making 89 year old female with history as above presenting for acute onset chest pain this morning while at rest.  Made worse with movement and inspiration.  Chest pain has resolved.  324 mg aspirin prior to arrival from EMS.  Slightly hypertensive otherwise well-appearing with a benign physical exam.  Differential diagnosis includes ACS, dysrhythmia, pneumonia, viral respiratory illness, pulmonary embolism, anemia and electrolyte imbalance.  Will obtain laboratory workup EKG chest x-ray to look for these pathologies and continue to monitor on telemetry.  Amount and/or Complexity of Data Reviewed Labs: ordered. Radiology: ordered.  Risk Prescription drug management.        Final diagnoses:  Pulmonary embolism, unspecified chronicity, unspecified pulmonary embolism type, unspecified whether acute cor pulmonale present (HCC)  Chest pain, unspecified type    ED Discharge Orders          Ordered    APIXABAN  (ELIQUIS ) VTE STARTER PACK (10MG  AND 5MG )       Note to Pharmacy: If starter pack unavailable, substitute with seventy-four 5 mg apixaban  tabs following the above SIG directions.   10/17/23 1041               Pamella Ozell A, DO 10/17/23 1103

## 2023-10-17 NOTE — ED Notes (Signed)
 CCMD called, pt on monitor

## 2023-10-17 NOTE — ED Triage Notes (Signed)
 BIB [facility] from home for CP, woke at 0500, CP onset 0630. Describes as intermittent and sporadic, and increases with movement and isnpriation. ASA 324mg  PTA. NSL 20g L AC. Alert, NAD, calm, interactive, resps e/u, speaking in clear complete sentences, VSS. EKG NSR with incomplete RBBB. LS CTA. Denies recent illness, cough, sob, NVD, fever or other sx. Skin W&D.

## 2023-10-17 NOTE — Discharge Instructions (Addendum)
 You were seen in the emerged part for chest pain Your blood work looked okay but did show an elevated test called D-dimer For this reason we get a CAT scan of your lungs which showed 1 small blood clot in the lower lungs To treat the blood clot we started you on a medication called Eliquis  which is a blood thinner You need to pick up this medication from your pharmacy and begin taking as directed You should follow-up with your primary care doctor for reevaluation and discuss continuation of Eliquis  Please be aware that you also tested positive for COVID today. Return to the emerged part for severe chest pain, trouble breathing or any other concerns

## 2023-10-17 NOTE — ED Notes (Signed)
 Pt not in room,pt taken to CT

## 2023-10-17 NOTE — ED Notes (Signed)
 Pt ambulated to bathroom with minimal assistance.
